# Patient Record
Sex: Male | Born: 1940
Health system: Southern US, Community
[De-identification: ages and names within clinical notes are randomized; demographics above are authoritative.]

## PROBLEM LIST (undated history)

## (undated) DIAGNOSIS — K449 Diaphragmatic hernia without obstruction or gangrene: Secondary | ICD-10-CM

## (undated) DIAGNOSIS — D126 Benign neoplasm of colon, unspecified: Secondary | ICD-10-CM

## (undated) DIAGNOSIS — M1990 Unspecified osteoarthritis, unspecified site: Secondary | ICD-10-CM

## (undated) DIAGNOSIS — K219 Gastro-esophageal reflux disease without esophagitis: Secondary | ICD-10-CM

## (undated) DIAGNOSIS — K649 Unspecified hemorrhoids: Secondary | ICD-10-CM

## (undated) DIAGNOSIS — R011 Cardiac murmur, unspecified: Secondary | ICD-10-CM

## (undated) DIAGNOSIS — I10 Essential (primary) hypertension: Secondary | ICD-10-CM

## (undated) DIAGNOSIS — K5790 Diverticulosis of intestine, part unspecified, without perforation or abscess without bleeding: Secondary | ICD-10-CM

## (undated) HISTORY — PX: POLYPECTOMY: SHX149

## (undated) HISTORY — PX: COLONOSCOPY: SHX174

## (undated) HISTORY — DX: Unspecified osteoarthritis, unspecified site: M19.90

## (undated) HISTORY — DX: Diverticulosis of intestine, part unspecified, without perforation or abscess without bleeding: K57.90

## (undated) HISTORY — DX: Unspecified hemorrhoids: K64.9

## (undated) HISTORY — DX: Benign neoplasm of colon, unspecified: D12.6

## (undated) HISTORY — PX: HERNIA REPAIR: SHX51

## (undated) HISTORY — DX: Diaphragmatic hernia without obstruction or gangrene: K44.9

## (undated) HISTORY — DX: Essential (primary) hypertension: I10

## (undated) HISTORY — DX: Gastro-esophageal reflux disease without esophagitis: K21.9

## (undated) HISTORY — DX: Cardiac murmur, unspecified: R01.1

---

## 1998-01-01 ENCOUNTER — Emergency Department (HOSPITAL_COMMUNITY): Admission: EM | Admit: 1998-01-01 | Discharge: 1998-01-02 | Payer: Self-pay | Admitting: Emergency Medicine

## 2000-06-19 ENCOUNTER — Inpatient Hospital Stay (HOSPITAL_COMMUNITY): Admission: EM | Admit: 2000-06-19 | Discharge: 2000-06-20 | Payer: Self-pay | Admitting: Emergency Medicine

## 2000-06-19 ENCOUNTER — Encounter: Payer: Self-pay | Admitting: Emergency Medicine

## 2000-06-20 ENCOUNTER — Encounter: Payer: Self-pay | Admitting: Cardiology

## 2002-08-22 ENCOUNTER — Encounter: Admission: RE | Admit: 2002-08-22 | Discharge: 2002-08-22 | Payer: Self-pay | Admitting: Family Medicine

## 2002-08-22 ENCOUNTER — Encounter: Payer: Self-pay | Admitting: Family Medicine

## 2004-10-31 ENCOUNTER — Emergency Department (HOSPITAL_COMMUNITY): Admission: EM | Admit: 2004-10-31 | Discharge: 2004-10-31 | Payer: Self-pay | Admitting: Emergency Medicine

## 2007-02-01 ENCOUNTER — Ambulatory Visit: Payer: Self-pay | Admitting: Thoracic Surgery

## 2007-02-06 ENCOUNTER — Ambulatory Visit (HOSPITAL_COMMUNITY): Admission: RE | Admit: 2007-02-06 | Discharge: 2007-02-06 | Payer: Self-pay | Admitting: Thoracic Surgery

## 2007-02-07 ENCOUNTER — Ambulatory Visit: Payer: Self-pay | Admitting: Thoracic Surgery

## 2007-05-09 ENCOUNTER — Encounter: Admission: RE | Admit: 2007-05-09 | Discharge: 2007-05-09 | Payer: Self-pay | Admitting: Thoracic Surgery

## 2007-05-09 ENCOUNTER — Ambulatory Visit: Payer: Self-pay | Admitting: Thoracic Surgery

## 2007-09-05 ENCOUNTER — Encounter: Admission: RE | Admit: 2007-09-05 | Discharge: 2007-09-05 | Payer: Self-pay | Admitting: Thoracic Surgery

## 2007-09-05 ENCOUNTER — Ambulatory Visit: Payer: Self-pay | Admitting: Thoracic Surgery

## 2008-03-04 ENCOUNTER — Ambulatory Visit: Payer: Self-pay | Admitting: Thoracic Surgery

## 2008-03-04 ENCOUNTER — Encounter: Admission: RE | Admit: 2008-03-04 | Discharge: 2008-03-04 | Payer: Self-pay | Admitting: Thoracic Surgery

## 2008-09-16 ENCOUNTER — Encounter: Admission: RE | Admit: 2008-09-16 | Discharge: 2008-09-16 | Payer: Self-pay | Admitting: Thoracic Surgery

## 2008-09-16 ENCOUNTER — Ambulatory Visit: Payer: Self-pay | Admitting: Thoracic Surgery

## 2008-12-03 ENCOUNTER — Encounter: Payer: Self-pay | Admitting: Gastroenterology

## 2009-01-16 DIAGNOSIS — D126 Benign neoplasm of colon, unspecified: Secondary | ICD-10-CM

## 2009-01-16 HISTORY — DX: Benign neoplasm of colon, unspecified: D12.6

## 2009-01-23 ENCOUNTER — Ambulatory Visit: Payer: Self-pay | Admitting: Gastroenterology

## 2009-02-06 ENCOUNTER — Encounter: Payer: Self-pay | Admitting: Gastroenterology

## 2009-02-06 ENCOUNTER — Ambulatory Visit: Payer: Self-pay | Admitting: Gastroenterology

## 2009-02-09 ENCOUNTER — Encounter: Payer: Self-pay | Admitting: Gastroenterology

## 2010-08-31 NOTE — Letter (Signed)
September 16, 2008   Joycelyn Rua, MD  [address] [place] [address]   Re:  Samuel [name], OLVEDA                  DOB:  October 02, 1940   Dear Dr. Lenise Arena:   The patient came today for followup.  His CT scan showed no evidence of  changes of his left pleural-based nodule, which we have been following.  This is for over 2 years.  Since there has been no change, and at most  it maybe slightly decreased in size, I will refer him back to you for  longterm followup.  I would suggest that he gets at least a chest x-ray  once a year as there is sometimes some slow growing cancers that can  occur after 2-3 years of followup.  I appreciate the opportunity of  seeing the patient.  His blood pressure was 146/86, pulse 71,  respirations 18, and sats were 98%.   Sincerely,   Ines Bloomer, M.D.  Electronically Signed   DPB/MEDQ  D:  09/16/2008  T:  09/17/2008  Job:  [id number]

## 2010-08-31 NOTE — Letter (Signed)
February 01, 2007   Joycelyn Rua, M.D.  [address]  [place] [address]   Re:  [name], [name]                  DOB:  1940/09/09   Dear Dr. Lenise Arena:   I saw Samuel [name] in the office today.  This 70 year old patient is a non-  smoker who was found to have an enlargement of a lesion in the left  lower lobe when compared to a CT scan of approximately 3 years ago.  It  has apparently increased on size.  The patient has had no fever or  chills.  He quit smoking in 1963.  He has had no hemoptysis.  He was  referred here for evaluation.   PAST MEDICAL HISTORY:  The past medical history is significant for  hypertension.   MEDICATIONS:  He indicates he is on no medication.   FAMILY HISTORY:  Family history is noncontributory.   SOCIAL HISTORY:  He is married with four children.  Quit smoking in  1963.  Does not drink alcohol.   REVIEW OF SYSTEMS:  Weight is 182 pounds, height is 6 feet. Weight has  been stable. CARDIAC:  No angina or atrial fibrillation. PULMONARY:  No  bronchitis or wheezing.  GI:  He has reflux and a hiatal hernia.  GU:  No kidney disease, dysuria or frequent urination.  VASCULAR:  No  claudication, DVT or TIA.  NEUROLOGICAL:  No dizziness, headaches, black  outs or seizures.  MUSCULOSKELETAL:  Denies joint pain.  PSYCHIATRIC:  No psychiatric illnesses.  ENT:  No change in eye sight or hearing.  HEMATOLOGIC:  No problems with bleeding or clotting disorders.   PHYSICAL EXAMINATION:  GENERAL:  He is a well-developed Caucasian male  in no acute distress.  HEENT:  Head, eyes, ears and nose are unremarkable.  NECK:  Supple  without thyromegaly, no supraclavicular or axillary adenopathy.  CHEST:  Clear to auscultation and percussion.  HEART:  Regular sinus rhythm, no murmurs.  ABDOMEN:  Soft, no hepatosplenomegaly.  EXTREMITIES:  Pulses are 2+, there is no clubbing or edema.   I discussed the situation with Samuel [name].  I have recommended that he  get a PET  scan.  I will see him back here after PET scan.  I think this  is probably a chronic problem.  Pulmonary function tests show an FVC of  3.89 with FEV1 of 3.21.  If this turns out to be negative on PET, then  will just follow him with serial CTs.   I appreciate the opportunity of seeing Samuel [name].   Ines Bloomer, M.D.  Electronically Signed   DPB/MEDQ  D:  02/01/2007  T:  02/02/2007  Job:  [id number]

## 2010-08-31 NOTE — Assessment & Plan Note (Signed)
OFFICE VISIT   Samuel [name], Samuel [name]  DOB:  07-11-1940                                        Sep 05, 2007  CHART #:  [id number]   HISTORY OF PRESENTING ILLNESS:  Samuel [name] is a 70 year old Caucasian  male, who presents to the office today for further followup regarding a  left lower lobe nodule.  He was last seen in the office on May 09, 2007, and a CAT scan that was done at that time showed a decrease in  size of this left lower lobe lung nodule, which is felt to be most  likely inflammatory in nature.  Patient has no complaints at this time.  He denies any fever, chills, night sweats, denies any weight-loss or  shortness of breath.   PHYSICAL EXAMINATION:  GENERAL:  This is a pleasant, 70 year old  Caucasian male, in no acute distress, alert, oriented and cooperative.  Blood pressure is 160/96, pulse rate 70, respirations 18, O2 sat 96% on  room air.  CARDIOVASCULAR:  Regular rate and rhythm, S1, S2, no murmurs, gallops or  rubs.  PULMONARY EXAM:  Clear to auscultation bilaterally.   The CT of the chest that was performed today showed stable subpleural  density in the left lower lobe, as compared to May 09, 2007.  This  has decreased in size, compared with the PET of October of 2008.  No new  or suspicious pulmonary findings and no lymphadenopathy.   IMPRESSION AND PLAN:  Left lower lobe nodule, which continues to  decrease in size (most likely inflammatory etiology).  Patient will  return in six months with CT of the chest.  He is to call for any  problems, questions or concerns in the interim.   Doree Fudge, PA   DZ/MEDQ  D:  09/05/2007  [name]:  09/05/2007  Job:  [id number]

## 2010-08-31 NOTE — Letter (Signed)
May 09, 2007   Joycelyn Rua, M.D.  [address], [place] [address]   Re:  Samuel [name], LOHMEYER                  DOB:  December 21, 1940   Dear Dr. Lenise Arena:   I saw [name] back today and repeated his CT scan.  It showed  improvement in his left lower lobe nodule, which has decreased in size  which goes along with this being inflammatory, and probably not a  cancer.  I think this is good news for him.   His blood pressure was 144/85.  Pulse:  92.  Respirations:  18.  Saturations were 94%.  I plan to see him back again in four months with  another CT scan for a final check.   Ines Bloomer, M.D.  Electronically Signed   DPB/MEDQ  D:  05/09/2007  T:  05/09/2007  Job:  [id number]

## 2010-08-31 NOTE — Letter (Signed)
February 07, 2007   Joycelyn Rua, M.D.  [address] [place] [address]   Re:  JULIES, CARMICKLE                  DOB:  26-Sep-1940   Dear Dr. Lenise Arena:   I saw Mr. Dermody back in the office today and we got a CT scan that  showed that his 1.5-cm pleural based nodule had no uptake on his PET  scan which would suggest benign etiology.  However, to be sure this is  not a slow-growing cancer, we will plan to see him back again in 3 weeks  with a CT scan.  His blood pressure is 167/80, pulse 76, respirations  20, sat was 96%.  Lungs were clear to auscultation percussion.   Ines Bloomer, M.D.  Electronically Signed   DPB/MEDQ  D:  02/07/2007  T:  02/08/2007  Job:  [id number]

## 2010-08-31 NOTE — Assessment & Plan Note (Signed)
OFFICE VISIT   [name], Samuel T  DOB:  01/03/1941                                        March 04, 2008  CHART #:  [id number]   HISTORY:  The patient is seen in routine followup on today's date.  He  has a history of a left lower lobe lung nodule that we have been  following by serial CT scans.  The patient reports that he has no new  pulmonary symptoms.  He specifically denies new cough, sputum  production, hemoptysis, or other constitutional symptoms.  He denies  weight loss.  He is having no significant issues related to shortness of  breath.  CT scan on 03/04/2008, was reviewed by Dr. Edwyna Shell.  Findings  include a stable pleural-based nodular density in the lateral left lower  lobe.  This is 11 x 13 mm, which is essentially the same measurements as  the previous study.  Findings also include a small-to-moderate hiatal  hernia and some mild right upper lobe emphysematous changes.   PHYSICAL EXAMINATION:  VITAL SIGNS:  Blood pressure is 165/92, pulse 79,  respirations 18, and oxygen saturation is 95% on room air.  GENERAL:  This is a well-developed, adult male in no acute distress.  PULMONARY:  Clear breath sounds throughout.  CARDIAC:  Regular rate and rhythm.   ASSESSMENT:  The patient is being followed closely for his left lower  lobe lung nodule.  His CT findings are very stable in appearance.  He  was seen on today's date by Dr. Edwyna Shell, who also reviewed the scan.  It  is Dr. Scheryl Darter opinion that the scan should be repeated in 6 months in  close followup.  We will schedule that and see him at that time.   Rowe Clack, P.A.-C.   Sherryll Burger  D:  03/04/2008  T:  03/05/2008  Job:  [id number]   cc:   Ines Bloomer, M.D.

## 2010-09-03 NOTE — Consult Note (Signed)
[place]. [facility]  Patient:    FAVOR, HACKLER                         MRN: [id number] Proc. Date: 06/19/00 Adm. Date:  [id number] Attending:  Clovis Cao CC:         Lacretia Leigh. Quintella Reichert, M.D.   Consultation Report  DATE OF BIRTH:  [birth year]  CHIEF COMPLAINT:  Chest pain.  REASON FOR ADMISSION:  I appreciate the opportunity to participate in the care of this very nice 70 year old Mr. Henri Medal, by providing consultative services to Dr. Lacretia Leigh. Hooper, his primary physician, at Dr. Charlane Ferretti request, in regards to Mr. Lawsons chest pain.  HISTORY OF PRESENT ILLNESS:  Mr. Sasso says that he has had a cough and bronchitis repeatedly over about the last three months since October or so. He was given oral antibiotics, and has had recurrent episodes, with a persistent cough and a feeling of congestion in his chest.  He has had several doses of oral steroids.  Approximately 10 days ago he first had an injection of prednisone and had a repeat injection of prednisone three days prior to this admission.  He has also been on oral prednisone, at a dose of 40 mg q.d.  Today, in addition to his cough and feeling of congestion, he developed a feeling of anterior chest pain which was somewhat related to deep breathing, but which he describes as crushing in quality.  There has been some diaphoresis as well, particularly when he has been walking.  He had no shortness of breath, nausea, vomiting, or radiation of the pain outside the anterior chest area.  He had no syncope.  He did not find anything which relieved the pain at home, but after coming to the emergency room, the pain did improve.  PAST MEDICAL HISTORY: 1. Hiatal hernia surgery by Dr. Izell Ravenna. Deaton in 1974.  He has never    had a problem with recurrence of symptoms of a hiatal hernia since    his operation. 2. Knee surgery in 1974. 3. At age 25 he suffered an anterior skull fracture when a  heavy    metal hook fell on his head.  There was primary treatment, followed    some months later by the need to remove infected fragments.  He    apparently never had any brain damage related to that. 4. Approximately two years ago he had pneumonia.  He had a pneumonia    vaccine last year.  He did not have a flu shot this year. 5. He has also been treated for hypertension.  CURRENT MEDICATIONS: 1. Cozaar 50 mg q.d. 2. Hydrochlorothiazide 12.5 mg q.d. 3. Prednisone as noted above.  FAMILY HISTORY:  His father died at age 61 of Alzheimers disease.  His mother died at age 74 of pancreatic cancer.  His brother died at age 17 of pancreatic cancer.  His sister died at age 85 of bone cancer.  SOCIAL HISTORY:  He has been married to his present wife for 42 years.  They have four children, who have grown up.  Two of their children live in the neighboring proximal two houses.  His other children are much further away. 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.  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  REVIEW OF SYSTEMS:  He has had no fever or chills.  His sweats with exertion are noted above.  He has not lost any weight.  He denies claudication or edema.  He says he has had very little sleep since he was placed on the prednisone, and that is about two weeks or oral prednisone.  EYES:  No diplopia or blurring.  He wears glasses for reading.  ENT:  No deafness or dizziness.  He has his own teeth.  CARDIOVASCULAR:  See the history of present illness.  No PND or orthopnea.  RESPIRATORY:  See the history of present illness.  He has never smoked to any significant degree.  GI:  No dysphagia or heartburn.  GENITOURINARY:  No dysuria or pyuria.  He has nocturia x 1. MUSCULOSKELETAL:  No swollen joints or myalgia.  SKIN/BREASTS:  No rash or nodule.  NEUROLOGIC:  No faintness or syncope.  PSYCHIATRIC:  No depression or hallucinations.  ENDOCRINE:  No  known diabetes or thyroid disease. HEMATOLOGY/LYMPH:  No swelling in the neck, axillae, or groins. ALLERGIC/LYMPHATICS:  He is made nervous by CODEINE, but has no other drug allergy.  PHYSICAL EXAMINATION:  VITAL SIGNS:  Blood pressure 161/80, pulse 80 and regular, respirations 16-18, unlabored, temperature 99 degrees.  GENERAL:  He is a well-developed, well-nourished man, who looks about his stated age of 11.  He is oriented to person, place, and time.  His mood and affect are normal.  HEENT:  Conjunctivae and lids reveal no xanthelasma or icterus.  His teeth are his own and in good repair.  Oral mucosa reveals no pallor or cyanosis.  NECK:  Supple and symmetrical.  Trachea is midline and mobile.  There is no palpable thyromegaly or cervical nodes.  No carotid bruit or jugular venous distention.  LUNGS:  His respiratory effort is normal at rest.  His lungs reveal scattered rhonchi, but few if any wheezes.  The forced expiratory time is normal.  BACK:  Straight and nontender.  NEUROLOGIC:  His gait is not tested.  It is unlikely that he could undergo a full stress test, given his recent pulmonary history.  Muscle strength and tone are age-appropriate.  CARDIAC:  The cardiac apical impulse is cryptic.  The left border of cardiac dullness is within the left anterior axillary line.  The heart rhythm is regular and the rate is normal.  There is no gallop, click, or murmur.  EXTREMITIES:  His digits and nails reveal no clubbing or cyanosis.  The femoral arteries are without bruit.  The pedal pulses are intact, with 2+ posterior tibial and dorsalis pedis pulses bilaterally.  The legs reveal no edema or varicosities.  SKIN:  The skin and subcutaneous tissue reveal no stasis dermatitis or ulcer.  ABDOMEN:  Flat, nontender.  There is no palpable enlargement of the liver or spleen.  Bowel sounds are present.  The abdominal aorta is not palpable, and he has no bruit.  LABORATORY  DATA:  The available laboratory data includes an electrocardiogram  which is normal.  First troponin I 0.03, CPK 137, with MB band of 3.7, unremarkable level. White cells 11,000, mean cell volume 87, hemoglobin 15.1, 87% polys, 12% lymphocytes, 1% monocytes.  Electrolytes normal except for potassium of 5.6. Glucose 119.  Chest x-ray shows the heart is of normal size.  There is slight increase in bronchovascular markings, consistent with pulmonary congestion.  This is read by the radiologist, as showing pulmonary vascular congestion, but I disagree.  IMPRESSION:  Mr. Mesta has new onset of anterior chest discomfort, which he describes  as crushing.  At this point, he has no evidence of an acute myocardial infarction, but certainly must be considered, to be at the right age to have the acute onset of new angina.  RECOMMENDATIONS:  The most appropriate course is to keep him in the hospital overnight and repeat his enzymes.  If his enzymes are negative, a Persantine Cardiolyte test could be done.  If his enzymes are positive, then he should have a cardiac catheterization.  I cannot exclude the possibility that some combination of his many known problems, mainly bronchitis, known hiatal hernia, and sleep deprivation, may be part of the reason for his chest discomfort, but certainly coronary artery disease now takes precedence in terms of our diagnostic evaluation.  Finally, will place him in a telemetry bed, and give him a sleeping pill to help get through the night.  He will be continued on prednisone. DD:  06/19/00 TD:  06/20/00 Job: [id number]

## 2011-02-03 ENCOUNTER — Encounter (HOSPITAL_COMMUNITY)
Admission: RE | Admit: 2011-02-03 | Discharge: 2011-02-03 | Disposition: A | Discharge status: Home / Self Care | Payer: Medicare Other | Source: Ambulatory Visit | Attending: Orthopedic Surgery | Admitting: Orthopedic Surgery

## 2011-02-03 ENCOUNTER — Other Ambulatory Visit (HOSPITAL_COMMUNITY): Payer: Self-pay | Admitting: Orthopedic Surgery

## 2011-02-03 DIAGNOSIS — M1712 Unilateral primary osteoarthritis, left knee: Secondary | ICD-10-CM

## 2011-02-03 LAB — COMPREHENSIVE METABOLIC PANEL WITH GFR
ALT: 21 U/L (ref 0–53)
AST: 28 U/L (ref 0–37)
Albumin: 3.9 g/dL (ref 3.5–5.2)
Alkaline Phosphatase: 90 U/L (ref 39–117)
BUN: 25 mg/dL — ABNORMAL HIGH (ref 6–23)
CO2: 28 meq/L (ref 19–32)
Calcium: 9.7 mg/dL (ref 8.4–10.5)
Chloride: 104 meq/L (ref 96–112)
Creatinine, Ser: 0.66 mg/dL (ref 0.50–1.35)
GFR calc Af Amer: >90 mL/min
GFR calc non Af Amer: >90 mL/min
Glucose, Bld: 101 mg/dL — ABNORMAL HIGH (ref 70–99)
Potassium: 5.3 meq/L — ABNORMAL HIGH (ref 3.5–5.1)
Sodium: 141 meq/L (ref 135–145)
Total Bilirubin: 0.3 mg/dL (ref 0.3–1.2)
Total Protein: 7.3 g/dL (ref 6.0–8.3)

## 2011-02-03 LAB — TYPE AND SCREEN
ABO/RH(D): B POS
Antibody Screen: NEGATIVE

## 2011-02-03 LAB — URINALYSIS, ROUTINE W REFLEX MICROSCOPIC
Bilirubin Urine: NEGATIVE
Leukocytes, UA: NEGATIVE
Nitrite: NEGATIVE
Specific Gravity, Urine: 1.021 (ref 1.005–1.030)
Urobilinogen, UA: 0.2 mg/dL (ref 0.0–1.0)

## 2011-02-03 LAB — CBC
HCT: 41.8 % (ref 39.0–52.0)
Hemoglobin: 14.7 g/dL (ref 13.0–17.0)
MCV: 87.1 fL (ref 78.0–100.0)
RBC: 4.8 MIL/uL (ref 4.22–5.81)
RDW: 13.9 % (ref 11.5–15.5)
WBC: 9.3 10*3/uL (ref 4.0–10.5)

## 2011-02-03 LAB — DIFFERENTIAL
Basophils Absolute: 0.1 K/uL (ref 0.0–0.1)
Basophils Relative: 1 % (ref 0–1)
Eosinophils Absolute: 0.3 K/uL (ref 0.0–0.7)
Eosinophils Relative: 3 % (ref 0–5)
Lymphocytes Relative: 31 % (ref 12–46)
Lymphs Abs: 2.9 K/uL (ref 0.7–4.0)
Monocytes Absolute: 0.9 K/uL (ref 0.1–1.0)
Monocytes Relative: 10 % (ref 3–12)
Neutro Abs: 5.1 K/uL (ref 1.7–7.7)
Neutrophils Relative %: 55 % (ref 43–77)

## 2011-02-03 LAB — PROTIME-INR
INR: 0.9 (ref 0.00–1.49)
Prothrombin Time: 12.3 s (ref 11.6–15.2)

## 2011-02-03 LAB — APTT: aPTT: 27 s (ref 24–37)

## 2011-02-03 LAB — ABO/RH: ABO/RH(D): B POS

## 2011-02-03 LAB — SURGICAL PCR SCREEN
MRSA, PCR: NEGATIVE
Staphylococcus aureus: NEGATIVE

## 2011-02-04 LAB — URINE CULTURE: Culture: NO GROWTH

## 2011-02-14 ENCOUNTER — Inpatient Hospital Stay (HOSPITAL_COMMUNITY)
Admission: RE | Admit: 2011-02-14 | Discharge: 2011-02-16 | DRG: 470 | Disposition: A | Discharge status: Home Health | Payer: Medicare Other | Source: Ambulatory Visit | Attending: Orthopedic Surgery | Admitting: Orthopedic Surgery

## 2011-02-14 DIAGNOSIS — Z01812 Encounter for preprocedural laboratory examination: Secondary | ICD-10-CM

## 2011-02-14 DIAGNOSIS — D62 Acute posthemorrhagic anemia: Secondary | ICD-10-CM | POA: Diagnosis not present

## 2011-02-14 DIAGNOSIS — Z01810 Encounter for preprocedural cardiovascular examination: Secondary | ICD-10-CM

## 2011-02-14 DIAGNOSIS — M1710 Unilateral primary osteoarthritis, unspecified knee: Principal | ICD-10-CM | POA: Diagnosis present

## 2011-02-14 DIAGNOSIS — Z01818 Encounter for other preprocedural examination: Secondary | ICD-10-CM

## 2011-02-14 DIAGNOSIS — K219 Gastro-esophageal reflux disease without esophagitis: Secondary | ICD-10-CM | POA: Diagnosis present

## 2011-02-14 DIAGNOSIS — I10 Essential (primary) hypertension: Secondary | ICD-10-CM | POA: Diagnosis present

## 2011-02-14 DIAGNOSIS — IMO0002 Reserved for concepts with insufficient information to code with codable children: Principal | ICD-10-CM | POA: Diagnosis present

## 2011-02-14 HISTORY — PX: REPLACEMENT TOTAL KNEE: SUR1224

## 2011-02-15 LAB — BASIC METABOLIC PANEL
BUN: 14 mg/dL (ref 6–23)
CO2: 26 mEq/L (ref 19–32)
Chloride: 103 mEq/L (ref 96–112)
Creatinine, Ser: 0.77 mg/dL (ref 0.50–1.35)

## 2011-02-15 LAB — CBC
HCT: 29.3 % — ABNORMAL LOW (ref 39.0–52.0)
MCH: 30.1 pg (ref 26.0–34.0)
MCHC: 33.8 g/dL (ref 30.0–36.0)
MCV: 89.1 fL (ref 78.0–100.0)
RDW: 13.5 % (ref 11.5–15.5)

## 2011-02-15 NOTE — Op Note (Signed)
NAMEHAZEL, WRINKLE                  ACCOUNT NO.:  [id number]  MEDICAL RECORD NO.:  [id number]  LOCATION:  2899                         FACILITY:  [facility]  PHYSICIAN:  Mila Homer. Sherlean Foot, M.D. DATE OF BIRTH:  [birth year]  DATE OF PROCEDURE:  02/14/2011 DATE OF DISCHARGE:                              OPERATIVE REPORT   SURGEON:  Mila Homer. Sherlean Foot, MD  ASSISTANT:  Altamese Cabal, PA-C  ANESTHESIA:  General.  PREOPERATIVE DIAGNOSIS:  Left knee osteoarthritis.  POSTOPERATIVE DIAGNOSIS:  Left knee osteoarthritis.  PROCEDURE:  Left total knee arthroplasty.  INDICATION FOR PROCEDURE:  The patient is a 70 year old white male with failure of conservative measures for osteoarthritis of left knee. Informed consent was obtained.  DESCRIPTION OF PROCEDURE:  The patient was laid supine and administered general anesthesia.  The left leg was prepped and draped in the usual sterile fashion.  A midline incision was made with a #10 blade.  New blade was used to make a median parapatellar arthrotomy and perform synovectomy.  I elevated deep MCL off the medial crest of the tibia.  We did not go distally or much posteromedially since this was a valgus knee.  Preoperative range of motion was -10 to 105.With the valgus alignment at 15 degrees.  I then everted the patella.  There was extreme amount of osteophyte formation on the patella.  I removed those, measured to a 25 mm thick, reamed down 9 mm, and drilled 3 lug holes through the 35-mm template.  I recreated the native thickness.  I then subluxed the patella laterally and went into flexion.  I used the external medullary alignment system on the tibia to make perpendicular cut to the anatomic axis of the tibia.  I used the intramedullary system to make a 4-degree distal femoral cut since this was a valgus knee.  I sized to a size F, pinned to 3-degree external rotation holes.  I then made the anterior, posterior, and chamfer cuts with sagittal saw.   I then placed a lamina spreader in the knee.  I did do a fair amount of releasing laterally to get flexion-extension gap balance with a 17-mm spacer block.  I then finished the femur with a size F finishing block, tibia with a size 7 tibial tray drilling keel.  I then trialed with a 7 tibia, F femur, 17 insert.  I did do a little bit more releasing on the IT band to obtain balance in extension.  At this point, I placed 7-mm trial insert and then a 35 patella.  I got good flexion-extension gap balance and excellent patellar tracking.  I removed all trial components and copiously irrigated.  I then cemented the components removing all excess cement, allowing the cement to harden extension.  I left a Hemovac coming out superolaterally and deep the arthrotomy pain catheter, and coming out superomedial and superficial to arthrotomy.  I let the tourniquet down and obtained hemostasis.  I then closed the arthrotomy with figure-of-eight #1 Vicryl suture, buried 0 Vicryl sutures, subcuticular 2-0 Vicryl stitch, skin  staples.  Dressed with Xeroform, dressing sponges, sterile Webril, Cody stocking.  COMPLICATIONS:  None.  DRAINS:  One Hemovac  and one pain catheter.  ESTIMATED BLOOD LOSS:  300 mL.  TOURNIQUET TIME:  56 minutes.          ______________________________ Mila Homer Sherlean Foot, M.D.     SDL/MEDQ  D:  02/14/2011  T:  02/14/2011  Job:  [id number]  cc:   Primary care physician  Electronically Signed by Georgena Spurling M.D. on 02/15/2011 05:27:06 PM

## 2011-02-16 LAB — BASIC METABOLIC PANEL
Calcium: 8.5 mg/dL (ref 8.4–10.5)
GFR calc non Af Amer: 84 mL/min — ABNORMAL LOW (ref >90)
Glucose, Bld: 112 mg/dL — ABNORMAL HIGH (ref 70–99)
Sodium: 142 mEq/L (ref 135–145)

## 2011-02-16 LAB — CBC
MCH: 29.3 pg (ref 26.0–34.0)
MCHC: 33 g/dL (ref 30.0–36.0)
Platelets: 184 10*3/uL (ref 150–400)

## 2011-04-25 ENCOUNTER — Ambulatory Visit (HOSPITAL_COMMUNITY)
Admission: RE | Admit: 2011-04-25 | Discharge: 2011-04-25 | Disposition: A | Discharge status: Home / Self Care | Payer: Medicare Other | Source: Ambulatory Visit | Attending: Orthopedic Surgery | Admitting: Orthopedic Surgery

## 2011-04-25 DIAGNOSIS — M79609 Pain in unspecified limb: Secondary | ICD-10-CM

## 2011-04-25 DIAGNOSIS — R52 Pain, unspecified: Secondary | ICD-10-CM

## 2011-04-25 DIAGNOSIS — IMO0002 Reserved for concepts with insufficient information to code with codable children: Secondary | ICD-10-CM

## 2011-04-25 DIAGNOSIS — R229 Localized swelling, mass and lump, unspecified: Secondary | ICD-10-CM | POA: Insufficient documentation

## 2011-04-25 NOTE — Progress Notes (Signed)
Left lower extremity venous duplex completed.  Preliminary report is negative for DVT, SVT, or a Baker's cyst. Smiley Houseman 04/25/2011, 8:37 AM

## 2011-12-05 ENCOUNTER — Other Ambulatory Visit (HOSPITAL_BASED_OUTPATIENT_CLINIC_OR_DEPARTMENT_OTHER): Payer: Self-pay | Admitting: Family Medicine

## 2011-12-05 DIAGNOSIS — R109 Unspecified abdominal pain: Secondary | ICD-10-CM

## 2011-12-06 ENCOUNTER — Ambulatory Visit (HOSPITAL_BASED_OUTPATIENT_CLINIC_OR_DEPARTMENT_OTHER)
Admission: RE | Admit: 2011-12-06 | Discharge: 2011-12-06 | Disposition: A | Discharge status: Home / Self Care | Payer: Medicare Other | Source: Ambulatory Visit | Attending: Family Medicine | Admitting: Family Medicine

## 2011-12-06 DIAGNOSIS — J984 Other disorders of lung: Secondary | ICD-10-CM | POA: Insufficient documentation

## 2011-12-06 DIAGNOSIS — R109 Unspecified abdominal pain: Secondary | ICD-10-CM

## 2011-12-06 DIAGNOSIS — K449 Diaphragmatic hernia without obstruction or gangrene: Secondary | ICD-10-CM | POA: Insufficient documentation

## 2011-12-06 DIAGNOSIS — K5730 Diverticulosis of large intestine without perforation or abscess without bleeding: Secondary | ICD-10-CM | POA: Insufficient documentation

## 2011-12-06 DIAGNOSIS — K7689 Other specified diseases of liver: Secondary | ICD-10-CM | POA: Insufficient documentation

## 2011-12-06 DIAGNOSIS — N400 Enlarged prostate without lower urinary tract symptoms: Secondary | ICD-10-CM | POA: Insufficient documentation

## 2011-12-06 DIAGNOSIS — N281 Cyst of kidney, acquired: Secondary | ICD-10-CM | POA: Insufficient documentation

## 2011-12-06 MED ORDER — IOHEXOL 300 MG/ML  SOLN
100.0000 mL | Freq: Once | INTRAMUSCULAR | Status: AC | PRN
  Administered 2011-12-06: 100 mL via INTRAVENOUS

## 2012-02-14 ENCOUNTER — Telehealth: Payer: Self-pay | Admitting: Gastroenterology

## 2012-02-14 NOTE — Telephone Encounter (Signed)
OK. He probably will need a work in appt with Amy or Gunnar Fusi since his appt is 2 months out.

## 2012-02-14 NOTE — Telephone Encounter (Signed)
Patient has been treated by his primary care for diverticulitis, but the CT showed diverticulosis.  The patient states that they had him finish out a course of cipro (he was unable to tolerate flagyl) for 10 days.  The patient reports that the pain resolved with the cipro.  He developed LLQ pain again yesterday was seen again by his primary care and they have started him on Augmentin.  He is requesting an appt for a colonoscopy.  He had a colonoscopy in 2010 that showed 2 polyps adenomatous and diverticulosis of the sigmoid colon.  He is given an appt for 03/12/12.  He will finish the antibiotics prescribed by his primary.  He will call me back if the pain is not resolved or worsens to be worked in with an extender.  He has only had screening colonoscopy with our office no other office visit history.

## 2012-02-20 ENCOUNTER — Telehealth: Payer: Self-pay | Admitting: Gastroenterology

## 2012-02-20 NOTE — Telephone Encounter (Signed)
Patient reports that he has been on Augmentin for 7 days with no improvement in the LLQ pain.  As discussed last week he was to call back for no improvement in his symptoms.  He will come in and see Mike Gip PA tomorrow at 10:30

## 2012-02-21 ENCOUNTER — Encounter: Payer: Self-pay | Admitting: Physician Assistant

## 2012-02-21 ENCOUNTER — Ambulatory Visit (INDEPENDENT_AMBULATORY_CARE_PROVIDER_SITE_OTHER): Payer: Medicare Other | Admitting: Physician Assistant

## 2012-02-21 VITALS — BP 140/80 | HR 64 | Ht 71.0 in | Wt 199.0 lb

## 2012-02-21 DIAGNOSIS — K5730 Diverticulosis of large intestine without perforation or abscess without bleeding: Secondary | ICD-10-CM

## 2012-02-21 DIAGNOSIS — I10 Essential (primary) hypertension: Secondary | ICD-10-CM

## 2012-02-21 DIAGNOSIS — K219 Gastro-esophageal reflux disease without esophagitis: Secondary | ICD-10-CM | POA: Insufficient documentation

## 2012-02-21 DIAGNOSIS — K5790 Diverticulosis of intestine, part unspecified, without perforation or abscess without bleeding: Secondary | ICD-10-CM

## 2012-02-21 MED ORDER — CIPROFLOXACIN HCL 500 MG PO TABS: 500.0000 mg | ORAL_TABLET | Freq: Two times a day (BID) | ORAL | Status: AC

## 2012-02-21 NOTE — Progress Notes (Signed)
Subjective:    Patient ID: NIL Samuel [name], male    DOB: 11-12-1940, 71 y.o.   MRN: [id number]  HPI Samuel [name] is a 71 year old white male known to Dr. Russella Dar from prior colonoscopy. He last had colonoscopy in October of 2010, was noted to have moderate diverticulosis of the descending colon and 3 polyps were removed 5 mm or less in size. These were tubular adenomas. He is scheduled for 5 year followup. Patient had an episode of diverticulitis which was treated by primary care in August of 2013 With a course of Flagyl and Cipro. Patient says that the Flagyl made him very nauseated and he could not of stay on the Flagyl however did complete a 10 day course of Cipro. His symptoms completely resolved. He did have a CT scan during that episode which did not show any evidence of diverticulitis. Patient states that he was doing well over the past few months until about a week ago when his left lower quadrant pain recurred. U. again was seen by primary care and was started on a course of Augmentin 875 twice daily 7 days ago. He had labs done at that time which showed a normal CBC. The patient states that he is about 25% better, but does not feel that he is responding as well to the Augmentin as he did the Cipro previously. He is not having any problems with fever or chills, no nausea or vomiting, he says his pain is mild to moderate. He is not having any problems with constipation or diarrhea and his appetite has been good. He is concerned, because his symptoms have not resolved after one week of antibiotics. He states that he has had previous episodes of diverticulitis but had not been bothered over the past few years.    Review of Systems  Constitutional: Negative.   HENT: Negative.   Eyes: Negative.   Respiratory: Negative.   Cardiovascular: Negative.   Gastrointestinal: Positive for abdominal pain.  Genitourinary: Negative.   Musculoskeletal: Negative.   Neurological: Negative.   Hematological: Negative.     Psychiatric/Behavioral: Negative.    Outpatient Prescriptions Prior to Visit  Medication Sig Dispense Refill  . aspirin 81 MG tablet Take 81 mg by mouth daily.      Marland Kitchen esomeprazole (NEXIUM) 40 MG capsule Take 40 mg by mouth daily before breakfast. Pt takes one tablet every 4 days      . hydrochlorothiazide (HYDRODIURIL) 25 MG tablet Take 25 mg by mouth as directed.      Marland Kitchen losartan (COZAAR) 100 MG tablet Take 100 mg by mouth daily.            Allergies  Allergen Reactions  . Codeine     REACTION: Agitation   Patient Active Problem List  Diagnosis  . Diverticulosis  . HTN (hypertension)  . GERD (gastroesophageal reflux disease)   History  Substance Use Topics  . Smoking status: Former Smoker    Quit date: 02/21/1964  . Smokeless tobacco: Never Used  . Alcohol Use: No    Objective:   Physical Exam a well-developed older white male in no acute distress, pleasant blood pressure 140/80 pulse 64 height 5 foot 11 weight 199. HEENT; nontraumatic normocephalic EOMI PERRLA sclera anicteric, Neck ;supple no JVD, Cardiovascular; regular rate and rhythm with S1-S2 no murmur or gallop, Pulmonary; clear bilaterally, Abdomen; soft nondistended, bowel sounds are active, he is mildly tender in the left lower quadrant no guarding or rebound no palpable mass or hepatosplenomegaly, Rectal; not done,  Extremities; no clubbing cyanosis or edema skin warm and dry, Psych; mood and affect normal and appropriate.        Assessment & Plan:  #48 71 year old male with recurrent acute diverticulitis, partially responsive to one-week course of Augmentin. He does not have any worrisome signs or symptoms currently and suspect he does need a longer course of antibiotics. #2 history of adenomatous colon polyp, last colonoscopy 2010 due for followup 2015 #3 on a GERD; continue Nexium.  Plan; patient will complete his 10 day course of Augmentin Will add Cipro 500 mg by mouth twice daily for 10 days Add align  one by mouth daily x2 weeks Patient will call with a progress report when he finishes the antibiotics, if his symptoms have not completely resolved at that time he will need repeat CT and reassessment area He also knows to call should the symptoms worsen at any point in the interim.

## 2012-02-21 NOTE — Progress Notes (Signed)
Reviewed and agree with management plans.  Leightyn Cina T. Kern Gingras MD FACG  

## 2012-02-21 NOTE — Patient Instructions (Addendum)
Take Align 1 capsule daily for 3 weeks. Finish the Augmentin. We sent a prescription for Cipro to [facility], [place].  Call Dr. Ardell Isaacs nurse Lavonna Rua, with your progress once you finish the antibiotics. Call [phone]. Choose option 2 for nurse.

## 2012-03-12 ENCOUNTER — Ambulatory Visit: Payer: Medicare Other | Admitting: Gastroenterology

## 2012-04-02 ENCOUNTER — Other Ambulatory Visit (INDEPENDENT_AMBULATORY_CARE_PROVIDER_SITE_OTHER): Payer: Medicare Other

## 2012-04-02 ENCOUNTER — Telehealth: Payer: Self-pay | Admitting: Gastroenterology

## 2012-04-02 DIAGNOSIS — R1032 Left lower quadrant pain: Secondary | ICD-10-CM

## 2012-04-02 LAB — CREATININE, SERUM: Creatinine, Ser: 1 mg/dL (ref 0.4–1.5)

## 2012-04-02 NOTE — Telephone Encounter (Signed)
Patient reports no improvement in his symptoms.  He is gassy, bloated and having continued abdominal pain.  He took peptobismol over the weekend and this am he has black stools.  I did explain that pepto bismol will turn the stools black.  He also reports diarrhea. The last office visit on 02/21/12, you were going to consider a CT.  Do you want to see him or set up the CT directly.  Mike Gip PA please advise

## 2012-04-02 NOTE — Telephone Encounter (Signed)
Yes, make sure he actually took the course of Cipro I gave him.. And if so, and still having pain then would do CT abd/pelvis with contrast  This week, and can decide from there  thanks

## 2012-04-02 NOTE — Telephone Encounter (Signed)
Patient is scheduled for CT on 04/03/12 10:30.  He will come for labs today.

## 2012-04-03 ENCOUNTER — Ambulatory Visit (INDEPENDENT_AMBULATORY_CARE_PROVIDER_SITE_OTHER)
Admission: RE | Admit: 2012-04-03 | Discharge: 2012-04-03 | Disposition: A | Discharge status: Home / Self Care | Payer: Medicare Other | Source: Ambulatory Visit | Attending: Physician Assistant | Admitting: Physician Assistant

## 2012-04-03 DIAGNOSIS — R1032 Left lower quadrant pain: Secondary | ICD-10-CM

## 2012-04-03 MED ORDER — IOHEXOL 300 MG/ML  SOLN
100.0000 mL | Freq: Once | INTRAMUSCULAR | Status: AC | PRN
  Administered 2012-04-03: 100 mL via INTRAVENOUS

## 2012-04-04 ENCOUNTER — Ambulatory Visit (AMBULATORY_SURGERY_CENTER): Payer: Medicare Other

## 2012-04-04 ENCOUNTER — Encounter: Payer: Self-pay | Admitting: Gastroenterology

## 2012-04-04 VITALS — Ht 71.0 in | Wt 199.2 lb

## 2012-04-04 DIAGNOSIS — Z1211 Encounter for screening for malignant neoplasm of colon: Secondary | ICD-10-CM

## 2012-04-04 DIAGNOSIS — Z86010 Personal history of colonic polyps: Secondary | ICD-10-CM

## 2012-04-04 DIAGNOSIS — R109 Unspecified abdominal pain: Secondary | ICD-10-CM

## 2012-04-04 MED ORDER — MOVIPREP 100 G PO SOLR: ORAL | Status: DC

## 2012-04-17 ENCOUNTER — Ambulatory Visit (AMBULATORY_SURGERY_CENTER): Payer: Medicare Other | Admitting: Gastroenterology

## 2012-04-17 ENCOUNTER — Encounter: Payer: Self-pay | Admitting: Gastroenterology

## 2012-04-17 VITALS — BP 149/91 | HR 59 | Temp 98.1°F | Resp 19 | Ht 71.0 in | Wt 199.2 lb

## 2012-04-17 DIAGNOSIS — R1032 Left lower quadrant pain: Secondary | ICD-10-CM

## 2012-04-17 DIAGNOSIS — Z86010 Personal history of colonic polyps: Secondary | ICD-10-CM

## 2012-04-17 DIAGNOSIS — Z1211 Encounter for screening for malignant neoplasm of colon: Secondary | ICD-10-CM

## 2012-04-17 DIAGNOSIS — R109 Unspecified abdominal pain: Secondary | ICD-10-CM

## 2012-04-17 MED ORDER — GLYCOPYRROLATE 2 MG PO TABS: 2.0000 mg | ORAL_TABLET | Freq: Two times a day (BID) | ORAL | Status: DC

## 2012-04-17 MED ORDER — SODIUM CHLORIDE 0.9 % IV SOLN: 500.0000 mL | INTRAVENOUS | Status: DC

## 2012-04-17 NOTE — Patient Instructions (Addendum)
YOU HAD AN ENDOSCOPIC PROCEDURE TODAY AT THE [facility]: Refer to the procedure report that was given to you for any specific questions about what was found during the examination.  If the procedure report does not answer your questions, please call your gastroenterologist to clarify.  If you requested that your care partner not be given the details of your procedure findings, then the procedure report has been included in a sealed envelope for you to review at your convenience later.  YOU SHOULD EXPECT: Some feelings of bloating in the abdomen. Passage of more gas than usual.  Walking can help get rid of the air that was put into your GI tract during the procedure and reduce the bloating. If you had a lower endoscopy (such as a colonoscopy or flexible sigmoidoscopy) you may notice spotting of blood in your stool or on the toilet paper. If you underwent a bowel prep for your procedure, then you may not have a normal bowel movement for a few days.  DIET: Your first meal following the procedure should be a light meal and then it is ok to progress to your normal diet.  A half-sandwich or bowl of soup is an example of a good first meal.  Heavy or fried foods are harder to digest and may make you feel nauseous or bloated.  Likewise meals heavy in dairy and vegetables can cause extra gas to form and this can also increase the bloating.  Drink plenty of fluids but you should avoid alcoholic beverages for 24 hours.  ACTIVITY: Your care partner should take you home directly after the procedure.  You should plan to take it easy, moving slowly for the rest of the day.  You can resume normal activity the day after the procedure however you should NOT DRIVE or use heavy machinery for 24 hours (because of the sedation medicines used during the test).    SYMPTOMS TO REPORT IMMEDIATELY: A gastroenterologist can be reached at any hour.  During normal business hours, 8:30 AM to 5:00 PM Monday through Friday,  call [phone].  After hours and on weekends, please call the GI answering service at [phone] who will take a message and have the physician on call contact you.   Following lower endoscopy (colonoscopy or flexible sigmoidoscopy):  Excessive amounts of blood in the stool  Significant tenderness or worsening of abdominal pains  Swelling of the abdomen that is new, acute  Fever of 100F or higher  FOLLOW UP: If any biopsies were taken you will be contacted by phone or by letter within the next 1-3 weeks.  Call your gastroenterologist if you have not heard about the biopsies in 3 weeks.  Our staff will call the home number listed on your records the next business day following your procedure to check on you and address any questions or concerns that you may have at that time regarding the information given to you following your procedure. This is a courtesy call and so if there is no answer at the home number and we have not heard from you through the emergency physician on call, we will assume that you have returned to your regular daily activities without incident.  SIGNATURES/CONFIDENTIALITY: You and/or your care partner have signed paperwork which will be entered into your electronic medical record.  These signatures attest to the fact that that the information above on your After Visit Summary has been reviewed and is understood.  Full responsibility of the confidentiality of this   discharge information lies with you and/or your care-partner.   Handouts on diverticulosis, high fiber diet, hemorrhoids Repeat colonoscopy in 5 years Start robinol as directed, your medicine is at the pharmacy Please call [phone] in the next 1-3 business days to schedule an appointment with Dr Russella Dar in 4-6 weeks

## 2012-04-17 NOTE — Op Note (Addendum)
[facility] [address] [place], [address]   COLONOSCOPY PROCEDURE REPORT  PATIENT: Samuel [name], Samuel [name]  MR#: [id number] BIRTHDATE: 1940-05-05 , 71  yrs. old GENDER: Male ENDOSCOPIST: Meryl Dare, MD, [facility] PROCEDURE DATE:  04/17/2012 PROCEDURE:   Colonoscopy, diagnostic ASA CLASS:   Class II INDICATIONS:Patient's personal history of adenomatous colon polyps, LLQ pain MEDICATIONS: MAC sedation, administered by CRNA and propofol (Diprivan) 300mg  IV DESCRIPTION OF PROCEDURE:   After the risks benefits and alternatives of the procedure were thoroughly explained, informed consent was obtained.  A digital rectal exam revealed no abnormalities of the rectum.   The Fuse-Demo-Scope and the Olympus pediatric colonoscope were introduced through the anus and advanced to the cecum, which was identified by both the appendix and ileocecal valve. No adverse events experienced.   The quality of the prep was good, using MoviPrep  The instrument was then slowly withdrawn as the colon was fully examined.  COLON FINDINGS: Moderate diverticulosis was noted in the sigmoid colon and descending colon.   The colon was otherwise normal. There was no diverticulosis, inflammation, polyps or cancers unless previously stated.  Retroflexed views revealed internal hemorrhoids. The time to cecum=7 minutes 52 seconds.  Withdrawal time=9 minutes 35 seconds.  The scope was withdrawn and the procedure completed.  COMPLICATIONS: There were no complications.  ENDOSCOPIC IMPRESSION: 1.   Moderate diverticulosis was noted in the sigmoid colon and descending colon 2.   Internal hemorrhoids  RECOMMENDATIONS: 1.  High fiber diet with liberal fluid intake. 2.  Repeat Colonoscopy in 5 years. 3.  Robinul forte 2 mg po bid, #60, 11 refills 4.  Office appt in 4-6 weeks   eSigned:  Meryl Dare, MD, [facility] 04/17/2012 11:25 AM   cc: Joycelyn Rua, MD

## 2012-04-17 NOTE — Progress Notes (Signed)
Patient did not experience any of the following events: a burn prior to discharge; a fall within the facility; wrong site/side/patient/procedure/implant event; or a hospital transfer or hospital admission upon discharge from the facility. (G8907) Patient did not have preoperative order for IV antibiotic SSI prophylaxis. (G8918)  

## 2012-04-19 ENCOUNTER — Telehealth: Payer: Self-pay | Admitting: *Deleted

## 2012-04-19 NOTE — Telephone Encounter (Signed)
   Follow up Call-  Call back number 04/17/2012  Post procedure Call Back phone  # [phone]  Permission to leave phone message Yes     Patient questions:  Do you have a fever, pain , or abdominal swelling? no Pain Score  0 *  Have you tolerated food without any problems? yes  Have you been able to return to your normal activities? yes  Do you have any questions about your discharge instructions: Diet   no Medications  no Follow up visit  no  Do you have questions or concerns about your Care? no  Actions: * If pain score is 4 or above: No action needed, pain <4.

## 2012-04-24 ENCOUNTER — Ambulatory Visit: Payer: Medicare Other | Admitting: Gastroenterology

## 2012-05-15 ENCOUNTER — Encounter: Payer: Self-pay | Admitting: Gastroenterology

## 2012-05-15 ENCOUNTER — Ambulatory Visit (INDEPENDENT_AMBULATORY_CARE_PROVIDER_SITE_OTHER): Payer: Medicare Other | Admitting: Gastroenterology

## 2012-05-15 VITALS — BP 140/72 | HR 72 | Ht 71.0 in | Wt 198.8 lb

## 2012-05-15 DIAGNOSIS — Z86010 Personal history of colonic polyps: Secondary | ICD-10-CM

## 2012-05-15 DIAGNOSIS — R1032 Left lower quadrant pain: Secondary | ICD-10-CM

## 2012-05-15 DIAGNOSIS — K219 Gastro-esophageal reflux disease without esophagitis: Secondary | ICD-10-CM

## 2012-05-15 NOTE — Patient Instructions (Addendum)
Patient advised to avoid spicy, acidic, citrus, chocolate, mints, fruit and fruit juices.  Limit the intake of caffeine, alcohol and Soda.  Don't exercise too soon after eating.  Don't lie down within 3-4 hours of eating.  Elevate the head of your bed.  Continue to take Nexium one tablet by mouth every day 30 minutes before breakfast.  Modify your High Fiber diet as needed.  Follow up as needed.   cc: Joycelyn Rua, MD

## 2012-05-15 NOTE — Progress Notes (Signed)
History of Present Illness: This is a 72 year old male with recurrent left lower quadrant pain and substernal soreness. He states his left lower quadrant pain is 70-80% better since beginning Robinul. He feels his high fiber diet increases intestinal gas and worsens his pain. States he takes his Nexium about every other or every third day and has occasional postprandial substernal aching.   Current Medications, Allergies, Past Medical History, Past Surgical History, Family History and Social History were reviewed in [employer] record.  Physical Exam: General: Well developed , well nourished, no acute distress Head: Normocephalic and atraumatic Eyes:  sclerae anicteric, EOMI Ears: Normal auditory acuity Mouth: No deformity or lesions Lungs: Clear throughout to auscultation Heart: Regular rate and rhythm; no murmurs, rubs or bruits Abdomen: Soft, non tender and non distended. No masses, hepatosplenomegaly or hernias noted. Normal Bowel sounds Musculoskeletal: Symmetrical with no gross deformities  Pulses:  Normal pulses noted Extremities: No clubbing, cyanosis, edema or deformities noted Neurological: Alert oriented x 4, grossly nonfocal Psychological:  Alert and cooperative. Anxious.  Assessment and Recommendations:  1. LLQ pain. Diverticulosis. Symptoms controlled with glycopyrrolate. Continue glycopyrrolate twice a day. Modify high fiber diet per his preference to avoid excessive intestinal gas. May continue to use a daily probiotic he feels is beneficial. No further gastrointestinal evaluation needed at this time. Ongoing followup with his primary physician. GI followup when necessary.  2. Personal history of adenomatous colon polyps. Surveillance colonoscopy in 5 years.  3. GERD. Take Nexium 40 mg daily as prescribed. All standard antireflux measures. Use Gaviscon or Maalox as needed for breakthrough symptoms. Ongoing followup with his primary care physician. GI  followup when necessary.

## 2013-05-31 ENCOUNTER — Encounter (HOSPITAL_BASED_OUTPATIENT_CLINIC_OR_DEPARTMENT_OTHER): Payer: Self-pay | Admitting: Emergency Medicine

## 2013-05-31 ENCOUNTER — Emergency Department (HOSPITAL_BASED_OUTPATIENT_CLINIC_OR_DEPARTMENT_OTHER)
Admission: EM | Admit: 2013-05-31 | Discharge: 2013-05-31 | Disposition: A | Discharge status: Home / Self Care | Payer: Medicare Other | Attending: Emergency Medicine | Admitting: Emergency Medicine

## 2013-05-31 ENCOUNTER — Emergency Department (HOSPITAL_BASED_OUTPATIENT_CLINIC_OR_DEPARTMENT_OTHER): Payer: Medicare Other

## 2013-05-31 DIAGNOSIS — K219 Gastro-esophageal reflux disease without esophagitis: Secondary | ICD-10-CM | POA: Insufficient documentation

## 2013-05-31 DIAGNOSIS — I10 Essential (primary) hypertension: Secondary | ICD-10-CM | POA: Insufficient documentation

## 2013-05-31 DIAGNOSIS — Z79899 Other long term (current) drug therapy: Secondary | ICD-10-CM | POA: Insufficient documentation

## 2013-05-31 DIAGNOSIS — R0789 Other chest pain: Secondary | ICD-10-CM | POA: Insufficient documentation

## 2013-05-31 DIAGNOSIS — R079 Chest pain, unspecified: Secondary | ICD-10-CM

## 2013-05-31 DIAGNOSIS — Z860100 Personal history of colon polyps, unspecified: Secondary | ICD-10-CM | POA: Insufficient documentation

## 2013-05-31 DIAGNOSIS — Z87891 Personal history of nicotine dependence: Secondary | ICD-10-CM | POA: Insufficient documentation

## 2013-05-31 DIAGNOSIS — Z7982 Long term (current) use of aspirin: Secondary | ICD-10-CM | POA: Insufficient documentation

## 2013-05-31 DIAGNOSIS — Z8739 Personal history of other diseases of the musculoskeletal system and connective tissue: Secondary | ICD-10-CM | POA: Insufficient documentation

## 2013-05-31 LAB — CBC WITH DIFFERENTIAL/PLATELET
BASOS ABS: 0 10*3/uL (ref 0.0–0.1)
BASOS PCT: 0 % (ref 0–1)
Eosinophils Absolute: 0.1 10*3/uL (ref 0.0–0.7)
Eosinophils Relative: 1 % (ref 0–5)
HEMATOCRIT: 42.1 % (ref 39.0–52.0)
Hemoglobin: 14.4 g/dL (ref 13.0–17.0)
LYMPHS PCT: 31 % (ref 12–46)
Lymphs Abs: 2.4 10*3/uL (ref 0.7–4.0)
MCH: 29.7 pg (ref 26.0–34.0)
MCHC: 34.2 g/dL (ref 30.0–36.0)
MCV: 86.8 fL (ref 78.0–100.0)
Monocytes Absolute: 0.7 10*3/uL (ref 0.1–1.0)
Monocytes Relative: 10 % (ref 3–12)
NEUTROS ABS: 4.4 10*3/uL (ref 1.7–7.7)
NEUTROS PCT: 58 % (ref 43–77)
PLATELETS: 200 10*3/uL (ref 150–400)
RBC: 4.85 MIL/uL (ref 4.22–5.81)
RDW: 13.3 % (ref 11.5–15.5)
WBC: 7.6 10*3/uL (ref 4.0–10.5)

## 2013-05-31 LAB — POCT I-STAT TROPONIN I
TROPONIN I, POC: 0.02 ng/mL (ref 0.00–0.08)
Troponin i, poc: 0.02 ng/mL (ref 0.00–0.08)

## 2013-05-31 LAB — BASIC METABOLIC PANEL
BUN: 24 mg/dL — ABNORMAL HIGH (ref 6–23)
CHLORIDE: 105 meq/L (ref 96–112)
CO2: 25 meq/L (ref 19–32)
CREATININE: 0.9 mg/dL (ref 0.50–1.35)
Calcium: 9.5 mg/dL (ref 8.4–10.5)
GFR calc non Af Amer: 82 mL/min — ABNORMAL LOW (ref >90)
Glucose, Bld: 101 mg/dL — ABNORMAL HIGH (ref 70–99)
POTASSIUM: 3.7 meq/L (ref 3.7–5.3)
SODIUM: 143 meq/L (ref 137–147)

## 2013-05-31 MED ORDER — GI COCKTAIL ~~LOC~~
30.0000 mL | Freq: Once | ORAL | Status: AC
  Administered 2013-05-31: 30 mL via ORAL
  Filled 2013-05-31: qty 30

## 2013-05-31 MED ORDER — ASPIRIN 81 MG PO CHEW
324.0000 mg | CHEWABLE_TABLET | Freq: Once | ORAL | Status: AC
  Administered 2013-05-31: 324 mg via ORAL
  Filled 2013-05-31: qty 4

## 2013-05-31 NOTE — Discharge Instructions (Signed)
Follow up with Cardiology for further evaluation. Refer to attached documents for more information. Return to the ED with worsening or concerning symptoms.

## 2013-05-31 NOTE — ED Provider Notes (Signed)
CSN: [id number]     Arrival date & time 05/31/13  1615 History   First MD Initiated Contact with Patient 05/31/13 1649     Chief Complaint  Patient presents with  . Chest Pain     (Consider location/radiation/quality/duration/timing/severity/associated sxs/prior Treatment) HPI Comments: Patient is a 73 year old male with a past medical history of hypertension and GERD who presents with chest pain that started 3 hours prior to arrival. Symptoms started gradually and remained constant since the onset. The pain feels like a tightness with occasional sharp pains and is moderate in severity. The pain does not radiate. Patient denies associated symptoms. Deep inspiration makes the pain worse. No alleviating factors. Patient reports doing heavy lifting yesterday and thinks the pain may be muscle tightness. Patient denies any previous MI.   Patient is a 73 y.o. male presenting with chest pain.  Chest Pain Associated symptoms: no abdominal pain, no dizziness, no dysphagia, no fatigue, no fever, no nausea, no palpitations, no shortness of breath, not vomiting and no weakness     Past Medical History  Diagnosis Date  . Hypertension   . Diverticulosis   . GERD (gastroesophageal reflux disease)   . Hemorrhoids   . Arthritis   . History of colon polyps    Past Surgical History  Procedure Laterality Date  . Hernia repair    . Colonoscopy    . Polypectomy    . Replacement total knee  02/14/11    left knee   Family History  Problem Relation Age of Onset  . Breast cancer Sister   . Pancreatic cancer Brother    History  Substance Use Topics  . Smoking status: Former Smoker    Types: Cigarettes    Quit date: 02/21/1964  . Smokeless tobacco: Never Used  . Alcohol Use: No    Review of Systems  Constitutional: Negative for fever, chills and fatigue.  HENT: Negative for trouble swallowing.   Eyes: Negative for visual disturbance.  Respiratory: Negative for shortness of breath.    Cardiovascular: Positive for chest pain. Negative for palpitations.  Gastrointestinal: Negative for nausea, vomiting, abdominal pain and diarrhea.  Genitourinary: Negative for dysuria and difficulty urinating.  Musculoskeletal: Negative for arthralgias and neck pain.  Skin: Negative for color change.  Neurological: Negative for dizziness and weakness.  Psychiatric/Behavioral: Negative for dysphoric mood.      Allergies  Codeine  Home Medications   Current Outpatient Rx  Name  Route  Sig  Dispense  Refill  . aspirin 81 MG tablet   Oral   Take 81 mg by mouth daily.         . Cholecalciferol (VITAMIN D3) 2000 UNITS TABS   Oral   Take by mouth daily.         Marland Kitchen esomeprazole (NEXIUM) 40 MG capsule   Oral   Take 40 mg by mouth daily before breakfast. Pt takes one tablet every 4 days         . glycopyrrolate (ROBINUL) 2 MG tablet   Oral   Take 1 tablet (2 mg total) by mouth 2 (two) times daily.   60 tablet   11   . hydrochlorothiazide (HYDRODIURIL) 25 MG tablet   Oral   Take 12.5 mg by mouth daily.          Marland Kitchen losartan (COZAAR) 100 MG tablet   Oral   Take 100 mg by mouth daily.         . Multiple Vitamin (MULTIVITAMIN) tablet  Oral   Take 1 tablet by mouth daily.         . vitamin E (VITAMIN E) 400 UNIT capsule   Oral   Take 400 Units by mouth daily.         Marland Kitchen zinc gluconate 50 MG tablet   Oral   Take 50 mg by mouth daily.          BP 162/92  Pulse 66  Temp(Src) 98.1 F (36.7 C) (Oral)  Resp 18  Ht 5\' 11"  (1.803 m)  Wt 197 lb (89.359 kg)  BMI 27.49 kg/m2  SpO2 97% Physical Exam  Nursing note and vitals reviewed. Constitutional: He is oriented to person, place, and time. He appears well-developed and well-nourished. No distress.  HENT:  Head: Normocephalic and atraumatic.  Eyes: Conjunctivae and EOM are normal.  Neck: Normal range of motion.  Cardiovascular: Normal rate and regular rhythm.  Exam reveals no gallop and no friction rub.    No murmur heard. Pulmonary/Chest: Effort normal and breath sounds normal. He has no wheezes. He has no rales. He exhibits tenderness.  Mild central chest tenderness to palpation.   Abdominal: Soft. There is no tenderness.  Musculoskeletal: Normal range of motion.  Neurological: He is alert and oriented to person, place, and time. Coordination normal.  Speech is goal-oriented. Moves limbs without ataxia.   Skin: Skin is warm and dry.  Psychiatric: He has a normal mood and affect. His behavior is normal.    ED Course  Procedures (including critical care time) Labs Review Labs Reviewed  BASIC METABOLIC PANEL - Abnormal; Notable for the following:    Glucose, Bld 101 (*)    BUN 24 (*)    GFR calc non Af Amer 82 (*)    All other components within normal limits  CBC WITH DIFFERENTIAL  POCT I-STAT TROPONIN I  POCT I-STAT TROPONIN I   Imaging Review Dg Chest 2 View  05/31/2013   CLINICAL DATA:  Left-sided chest pain.  EXAM: CHEST  2 VIEW  COMPARISON:  Chest x-ray 02/03/2011.  FINDINGS: Lung volumes are normal. No consolidative airspace disease. No pleural effusions. No pneumothorax. No pulmonary nodule or mass noted. Pulmonary vasculature and the cardiomediastinal silhouette are within normal limits.  IMPRESSION: 1.  No radiographic evidence of acute cardiopulmonary disease.   Electronically Signed   By: Vinnie Langton M.D.   On: 05/31/2013 18:23    EKG Interpretation   None       MDM   Final diagnoses:  Chest pain    6:56 PM Labs and troponin unremarkable. EKG shows NSR without acute changes. Vitals stable and patient afebrile.   9:32 PM Chest xray unremarkable for acute changes. Patient's delta troponin negative for changes. Patient's chest pain has improved. I spoke with Dr. Frances Furbish who is on call for cardiology tonight who feels the patient could be discharged or admitted to hospitalist for chest pain rule out based on our judgement. Patient would like to go home tonight  and follow up as an outpatient. Patient understands the risks of going home. Patient will return to the ED with worsening or concerning symptoms.   Alvina Chou, PA-C 05/31/13 2135

## 2013-05-31 NOTE — ED Notes (Signed)
Left sided/central cp started 3 hrs ago.  Tightness.  No radiation.  Denies nausea. Reports hx of gerd but sts it feels a little different.

## 2013-05-31 NOTE — ED Notes (Signed)
Patient transported to X-ray 

## 2013-06-01 NOTE — ED Provider Notes (Signed)
Medical screening examination/treatment/procedure(s) were conducted as a shared visit with non-physician practitioner(s) and myself.  I personally evaluated the patient during the encounter.  EKG Interpretation    Date/Time:  Friday May 31 2013 16:24:23 EST Ventricular Rate:  74 PR Interval:  228 QRS Duration: 92 QT Interval:  348 QTC Calculation: 386 R Axis:   35 Text Interpretation:  Sinus rhythm with 1st degree A-V block Otherwise normal ECG No significant change was found Confirmed by [facility]  MD, TREY ([id number]) on 05/31/2013 8:36:28 PM              Houston Siren III, MD 06/01/13 1500

## 2013-06-01 NOTE — ED Provider Notes (Signed)
Medical screening examination/treatment/procedure(s) were conducted as a shared visit with non-physician practitioner(s) and myself.  I personally evaluated the patient during the encounter.  EKG Interpretation    Date/Time:  Friday May 31 2013 16:24:23 EST Ventricular Rate:  74 PR Interval:  228 QRS Duration: 92 QT Interval:  348 QTC Calculation: 386 R Axis:   35 Text Interpretation:  Sinus rhythm with 1st degree A-V block Otherwise normal ECG No significant change was found Confirmed by [facility]  MD, TREY ([id number]) on 05/31/2013 8:36:28 PM            73 yo male with central chest pain which began a few hours ago.  He reports heavy lifting yesterday.  No nausea, diaphoresis, SOB, or light headedness.  Pain doesn't radiate.  On exam, pain reproducible with palpation and twisting torso.  II/VI systolic murmur appreciated with RRR.  Lungs CTAB.  His clinical picture is very suspicious for MSK chest pain.  Two troponin's were negative.  [facility] discussed case with Cardiologist due to apparent new murmur.  Ultimate plan was for discharge home with follow up.  Clinical Impression: 1. Chest pain       Houston Siren III, MD 06/01/13 1500

## 2013-06-20 ENCOUNTER — Telehealth: Payer: Self-pay | Admitting: Gastroenterology

## 2013-06-20 NOTE — Telephone Encounter (Signed)
Patient with reflux and would like to have a EGD for his recurrent CP.  He is scheduled with Dr. Fuller Plan on 07/08/13 9:15.  Patient instructed to maintain an anti-reflux diet. Advised to avoid caffeine, mint, citrus foods/juices, tomatoes,  chocolate, NSAIDS/ASA products.  Instructed not to eat within 2 hours of exercise or bed, multiple small meals are better than 3 large meals.  Need to take PPI 30 minutes prior to 1st meal of the day. He will call back for worsening symptoms

## 2013-07-08 ENCOUNTER — Ambulatory Visit (INDEPENDENT_AMBULATORY_CARE_PROVIDER_SITE_OTHER): Payer: Medicare Other | Admitting: Gastroenterology

## 2013-07-08 ENCOUNTER — Encounter: Payer: Self-pay | Admitting: Gastroenterology

## 2013-07-08 VITALS — BP 148/72 | HR 80 | Ht 70.0 in | Wt 197.4 lb

## 2013-07-08 DIAGNOSIS — R079 Chest pain, unspecified: Secondary | ICD-10-CM

## 2013-07-08 DIAGNOSIS — K219 Gastro-esophageal reflux disease without esophagitis: Secondary | ICD-10-CM

## 2013-07-08 MED ORDER — ESOMEPRAZOLE MAGNESIUM 40 MG PO CPDR: 40.0000 mg | DELAYED_RELEASE_CAPSULE | Freq: Two times a day (BID) | ORAL | Status: DC

## 2013-07-08 NOTE — Patient Instructions (Addendum)
You have been scheduled for an endoscopy with propofol. Please follow written instructions given to you at your visit today. If you use inhalers (even only as needed), please bring them with you on the day of your procedure.  We have sent the following medications to your pharmacy for you to pick up at your convenience: Nexium we increased to 40 twice daily.   Thank you for choosing me and [facility].  Pricilla Riffle. Dagoberto Ligas., MD., Marval Regal  cc: Christella Noa, MD

## 2013-07-08 NOTE — Progress Notes (Signed)
    History of Present Illness: This is a 73 year old male accompanied by his wife. He relates several months of recurrent chest pain. He occasionally has heartburn type symptoms in his mid substernal area but also has sharp pains in his left and right anterior chest and tightness across his chest that is frequently exacerbated by movement. He was seen in the emergency department for worsening chest pain following heavy lifting which was felt to be musculoskeletal. His Nexium was recently increased to twice daily and notes his heartburn and mid substernal chest pain has improved. Denies weight loss, abdominal pain, constipation, diarrhea, change in stool caliber, melena, hematochezia, nausea, vomiting, dysphagia.  Current Medications, Allergies, Past Medical History, Past Surgical History, Family History and Social History were reviewed in [employer] record.  Physical Exam: General: Well developed , well nourished, no acute distress Head: Normocephalic and atraumatic Eyes:  sclerae anicteric, EOMI Ears: Normal auditory acuity Mouth: No deformity or lesions Lungs: Clear throughout to auscultation, no chest wall tenderness Heart: Regular rate and rhythm; no murmurs, rubs or bruits Abdomen: Soft, non tender and non distended. No masses, hepatosplenomegaly or hernias noted. Normal Bowel sounds Musculoskeletal: Symmetrical with no gross deformities  Pulses:  Normal pulses noted Extremities: No clubbing, cyanosis, edema or deformities noted Neurological: Alert oriented x 4, grossly nonfocal Psychological:  Alert and cooperative. Anxious.  Assessment and Recommendations:  1. Chest pain, multifactorial. One component appears to be related to GERD. Continue Nexium 40 mg twice daily and standard antireflux measures. Rule out esophagitis, ulcer, UGI neoplasm. Schedule endoscopy further evaluation. The risks, benefits, and alternatives to endoscopy with possible biopsy and possible  dilation were discussed with the patient and they consent to proceed. Other components of his chest pain are not GI related, possibly musculoskeletal pain and anxiety, and I recommended that he followup with his primary physician.

## 2013-07-09 ENCOUNTER — Ambulatory Visit (HOSPITAL_COMMUNITY): Payer: Medicare Other | Attending: Cardiology | Admitting: Cardiology

## 2013-07-09 DIAGNOSIS — R011 Cardiac murmur, unspecified: Secondary | ICD-10-CM | POA: Insufficient documentation

## 2013-07-09 DIAGNOSIS — R079 Chest pain, unspecified: Secondary | ICD-10-CM

## 2013-07-09 NOTE — Progress Notes (Signed)
Echo completed

## 2013-07-10 ENCOUNTER — Telehealth: Payer: Self-pay | Admitting: Gastroenterology

## 2013-07-10 NOTE — Telephone Encounter (Signed)
OK to keep EGD appt unless he feels poorly and wants to reschedule.  If he does reschedule please to move later afternoon patient into this slot and I can keep start at 0830.

## 2013-07-10 NOTE — Telephone Encounter (Signed)
Patient notified.  He wants to keep appt for Friday

## 2013-07-10 NOTE — Telephone Encounter (Signed)
Patient with root canal this week and infection at site.  Dentist has put him on prednisone.  He is scheduled for EGD for this Friday for you.  OK to keep appt?

## 2013-07-11 ENCOUNTER — Encounter: Payer: Self-pay | Admitting: Gastroenterology

## 2013-07-12 ENCOUNTER — Ambulatory Visit (AMBULATORY_SURGERY_CENTER): Payer: Medicare Other | Admitting: Gastroenterology

## 2013-07-12 ENCOUNTER — Encounter: Payer: Self-pay | Admitting: Gastroenterology

## 2013-07-12 VITALS — BP 159/84 | HR 64 | Temp 97.9°F | Resp 14 | Ht 70.0 in | Wt 197.0 lb

## 2013-07-12 DIAGNOSIS — K229 Disease of esophagus, unspecified: Secondary | ICD-10-CM

## 2013-07-12 DIAGNOSIS — K219 Gastro-esophageal reflux disease without esophagitis: Secondary | ICD-10-CM

## 2013-07-12 DIAGNOSIS — K2940 Chronic atrophic gastritis without bleeding: Secondary | ICD-10-CM

## 2013-07-12 DIAGNOSIS — K2970 Gastritis, unspecified, without bleeding: Secondary | ICD-10-CM

## 2013-07-12 DIAGNOSIS — K2270 Barrett's esophagus without dysplasia: Secondary | ICD-10-CM

## 2013-07-12 DIAGNOSIS — R079 Chest pain, unspecified: Secondary | ICD-10-CM

## 2013-07-12 DIAGNOSIS — K2990 Gastroduodenitis, unspecified, without bleeding: Secondary | ICD-10-CM

## 2013-07-12 MED ORDER — SODIUM CHLORIDE 0.9 % IV SOLN: 500.0000 mL | INTRAVENOUS | Status: DC

## 2013-07-12 NOTE — Progress Notes (Signed)
Recovery initiated in procedure room at 0941.

## 2013-07-12 NOTE — Progress Notes (Signed)
No complaints noted in the recovery room. Maw   

## 2013-07-12 NOTE — Patient Instructions (Signed)
YOU HAD AN ENDOSCOPIC PROCEDURE TODAY AT THE [facility]: Refer to the procedure report that was given to you for any specific questions about what was found during the examination.  If the procedure report does not answer your questions, please call your gastroenterologist to clarify.  If you requested that your care partner not be given the details of your procedure findings, then the procedure report has been included in a sealed envelope for you to review at your convenience later.  YOU SHOULD EXPECT: Some feelings of bloating in the abdomen. Passage of more gas than usual.  Walking can help get rid of the air that was put into your GI tract during the procedure and reduce the bloating. If you had a lower endoscopy (such as a colonoscopy or flexible sigmoidoscopy) you may notice spotting of blood in your stool or on the toilet paper. If you underwent a bowel prep for your procedure, then you may not have a normal bowel movement for a few days.  DIET: Your first meal following the procedure should be a light meal and then it is ok to progress to your normal diet.  A half-sandwich or bowl of soup is an example of a good first meal.  Heavy or fried foods are harder to digest and may make you feel nauseous or bloated.  Likewise meals heavy in dairy and vegetables can cause extra gas to form and this can also increase the bloating.  Drink plenty of fluids but you should avoid alcoholic beverages for 24 hours.  ACTIVITY: Your care partner should take you home directly after the procedure.  You should plan to take it easy, moving slowly for the rest of the day.  You can resume normal activity the day after the procedure however you should NOT DRIVE or use heavy machinery for 24 hours (because of the sedation medicines used during the test).    SYMPTOMS TO REPORT IMMEDIATELY: A gastroenterologist can be reached at any hour.  During normal business hours, 8:30 AM to 5:00 PM Monday through Friday,  call [phone].  After hours and on weekends, please call the GI answering service at [phone] who will take a message and have the physician on call contact you.     Following upper endoscopy (EGD)  Vomiting of blood or coffee ground material  New chest pain or pain under the shoulder blades  Painful or persistently difficult swallowing  New shortness of breath  Fever of 100F or higher  Black, tarry-looking stools  FOLLOW UP: If any biopsies were taken you will be contacted by phone or by letter within the next 1-3 weeks.  Call your gastroenterologist if you have not heard about the biopsies in 3 weeks.  Our staff will call the home number listed on your records the next business day following your procedure to check on you and address any questions or concerns that you may have at that time regarding the information given to you following your procedure. This is a courtesy call and so if there is no answer at the home number and we have not heard from you through the emergency physician on call, we will assume that you have returned to your regular daily activities without incident.  SIGNATURES/CONFIDENTIALITY: You and/or your care partner have signed paperwork which will be entered into your electronic medical record.  These signatures attest to the fact that that the information above on your After Visit Summary has been reviewed and is understood.    Full responsibility of the confidentiality of this discharge information lies with you and/or your care-partner.    Await biopsy results. Handouts were given to your care partner on GERD, Hiatal Hernia and gastritis. Continue taking acid reducer medications 2 x per day.  You may resume your current medications today. Return to PCP for further evaluation of chest pain.  GERD and gastritis do not fully explain his chest pain symptoms. Please call if any questions or concerns.

## 2013-07-12 NOTE — Progress Notes (Signed)
Stable to RR 

## 2013-07-12 NOTE — Op Note (Signed)
[place]  [address]. [place], [address]   ENDOSCOPY PROCEDURE REPORT  PATIENT: [name], Samuel  MR#: [id number] BIRTHDATE: [birth year] , 22  yrs. old GENDER: Male ENDOSCOPIST: Ladene Artist, MD, [facility] PROCEDURE DATE:  07/12/2013 PROCEDURE:  EGD w/ biopsy ASA CLASS:     Class II INDICATIONS:  Chest pain.   History of esophageal reflux. MEDICATIONS: MAC sedation, administered by CRNA and propofol (Diprivan) 100mg  IV TOPICAL ANESTHETIC: none DESCRIPTION OF PROCEDURE: After the risks benefits and alternatives of the procedure were thoroughly explained, informed consent was obtained.  The LB [id number] endoscope was introduced through the mouth and advanced to the second portion of the duodenum. Without limitations.  The instrument was slowly withdrawn as the mucosa was fully examined.  ESOPHAGUS: A variable Z-line was observed 40 cm from the incisors. Multiple biopsies were performed.   The esophagus was otherwise normal. STOMACH: Mild non gastritis was found in the gastric antrum, gastric body, and gastric fundus.  Multiple biopsies were performed.   The stomach otherwise appeared normal. DUODENUM: The duodenal mucosa showed no abnormalities in the bulb and second portion of the duodenum.  Retroflexed views revealed a 4 cm hiatal hernia.     The scope was then withdrawn from the patient and the procedure completed.  COMPLICATIONS: There were no complications.  ENDOSCOPIC IMPRESSION: 1.   Variable Z-line; multiple biopsies 2.   Hiatal hernia, 4 cm 3.   Gastritis in the antrum, body, and fundus; multiple biopsies  RECOMMENDATIONS: 1.  Anti-reflux regimen long term 2.  Await pathology results 3.  Continue PPI BID 4.  Return to PCP for further evaluation. GERD and gastritis do not fully explain his chest pain symptoms.   eSigned:  Ladene Artist, MD, [facility] 07/12/2013 9:39 AM   [id number] Samuel Pel, MD

## 2013-07-12 NOTE — Progress Notes (Signed)
Called to room to assist during endoscopic procedure.  Patient ID and intended procedure confirmed with present staff. Received instructions for my participation in the procedure from the performing physician.  

## 2013-07-15 ENCOUNTER — Telehealth: Payer: Self-pay | Admitting: *Deleted

## 2013-07-15 NOTE — Telephone Encounter (Signed)
  Follow up Call-  Call back number 07/12/2013 04/17/2012  Post procedure Call Back phone  # [phone]  Permission to leave phone message Yes Yes     Patient questions:  FAX number; unable to leave message.

## 2013-07-19 ENCOUNTER — Other Ambulatory Visit: Payer: Self-pay | Admitting: Gastroenterology

## 2013-07-21 ENCOUNTER — Encounter: Payer: Self-pay | Admitting: Gastroenterology

## 2013-07-29 ENCOUNTER — Encounter: Payer: Self-pay | Admitting: Cardiology

## 2013-07-29 ENCOUNTER — Ambulatory Visit (INDEPENDENT_AMBULATORY_CARE_PROVIDER_SITE_OTHER): Payer: Medicare Other | Admitting: Cardiology

## 2013-07-29 VITALS — BP 177/94 | HR 75 | Ht 70.0 in | Wt 195.0 lb

## 2013-07-29 DIAGNOSIS — R079 Chest pain, unspecified: Secondary | ICD-10-CM | POA: Insufficient documentation

## 2013-07-29 DIAGNOSIS — I10 Essential (primary) hypertension: Secondary | ICD-10-CM

## 2013-07-29 NOTE — Progress Notes (Signed)
HPI: 73 year old male for evaluation of chest pain. Seen in emergency room in February of 2015 with chest pain. Chest x-ray negative. Troponin and hemoglobin normal. Echocardiogram in March of 2015 showed normal LV function, grade 1 diastolic dysfunction, mild left atrial enlargement and mild mitral regurgitation. Probable liver cyst and ultrasound recommended. He had a followup ultrasound recently by his report which showed cysts. Patient states that he has an occasional sharp chest pain for seconds. He has noticed dyspnea with more vigorous activities but not routine activities. No orthopnea, PND or pedal edema. No exertional chest pain. His bout of chest pain in February Occurred the following day after moving heavy logs. It increases with certain movements. No radiation or associated symptoms. He had another episode after working hard approximately 2 weeks ago.  Current Outpatient Prescriptions  Medication Sig Dispense Refill  . aspirin 81 MG tablet Take 81 mg by mouth daily.      . Cholecalciferol (VITAMIN D3) 2000 UNITS TABS Take by mouth daily.      . Coenzyme Q10 (COQ-10) 100 MG CAPS Take 1 capsule by mouth daily.      Marland Kitchen esomeprazole (NEXIUM) 40 MG capsule Take 1 capsule (40 mg total) by mouth 2 (two) times daily before a meal. Pt takes one tablet every 4 days  60 capsule  11  . glycopyrrolate (ROBINUL) 2 MG tablet TAKE 1 TABLET (2 MG TOTAL) BY MOUTH 2 (TWO) TIMES DAILY.  60 tablet  5  . hydrochlorothiazide (HYDRODIURIL) 25 MG tablet Take 12.5 mg by mouth daily.       Marland Kitchen losartan (COZAAR) 100 MG tablet Take 100 mg by mouth daily.      . Multiple Vitamin (MULTIVITAMIN) tablet Take 1 tablet by mouth daily.      . vitamin B-12 (CYANOCOBALAMIN) 1000 MCG tablet Take 1,000 mcg by mouth daily.      . vitamin E (VITAMIN E) 400 UNIT capsule Take 400 Units by mouth daily.      . Zinc 50 MG TABS Take 1 tablet by mouth daily.       No current facility-administered medications for this visit.     Allergies  Allergen Reactions  . Codeine     REACTION: Agitation    Past Medical History  Diagnosis Date  . Hypertension   . Diverticulosis   . GERD (gastroesophageal reflux disease)   . Hemorrhoids   . Arthritis   . Tubular adenoma of colon 01/2009  . Hiatal hernia   . Heart murmur     Past Surgical History  Procedure Laterality Date  . Hernia repair    . Colonoscopy    . Polypectomy    . Replacement total knee  02/14/11    left knee    History   Social History  . Marital Status: Married    Spouse Name: N/A    Number of Children: 4  . Years of Education: N/A   Occupational History  . Not on file.   Social History Main Topics  . Smoking status: Former Smoker    Types: Cigarettes    Quit date: 02/21/1964  . Smokeless tobacco: Never Used  . Alcohol Use: No  . Drug Use: No  . Sexual Activity: Not on file   Other Topics Concern  . Not on file   Social History Narrative  . No narrative on file    Family History  Problem Relation Age of Onset  . Breast cancer Sister   . Pancreatic  cancer Brother   . Colon cancer Neg Hx   . Esophageal cancer Neg Hx   . Rectal cancer Neg Hx   . Stomach cancer Neg Hx     ROS: no fevers or chills, productive cough, hemoptysis, dysphasia, odynophagia, melena, hematochezia, dysuria, hematuria, rash, seizure activity, orthopnea, PND, pedal edema, claudication. Remaining systems are negative.  Physical Exam:   Blood pressure 177/94, pulse 75, height 5\' 10"  (1.778 m), weight 195 lb (88.451 kg).  General:  Well developed/well nourished in NAD Skin warm/dry Patient not depressed No peripheral clubbing Back-normal HEENT-normal/normal eyelids Neck supple/normal carotid upstroke bilaterally; no bruits; no JVD; no thyromegaly chest - CTA/ normal expansion CV - RRR/normal S1 and S2; no rubs or gallops;  PMI nondisplaced, 2/6 systolic murmur left sternal border. Abdomen -NT/ND, no HSM, no mass, + bowel sounds, no  bruit 2+ femoral pulses, no bruits Ext-no edema, chords, 2+ DP Neuro-grossly nonfocal  ECG February 2015-sinus rhythm, first degree AV block, no ST changes. Today's electrocardiogram reveals sinus rhythm, first degree AV block, no ST changes.

## 2013-07-29 NOTE — Assessment & Plan Note (Signed)
Blood pressure is elevated. However he states it is typically controlled. Continue to follow at home and add additional medications as needed.

## 2013-07-29 NOTE — Patient Instructions (Signed)
Your physician recommends that you schedule a follow-up appointment in: AS NEEDED PENDING TEST RESULTS  Your physician has requested that you have an exercise tolerance test. For further information please visit [email or web address]. Please also follow instruction sheet, as given.    Exercise Stress Electrocardiogram An exercise stress electrocardiogram is a test to check how blood flows to your heart. It is done to find areas of poor blood flow. You will need to walk on a treadmill for this test. The electrocardiogram will record your heartbeat when you are at rest and when you are exercising. BEFORE THE PROCEDURE  Do not have drinks with caffeine or foods with caffeine for 24 hours before the test, or as told by your doctor.  Follow your doctor's instructions about eating and drinking before the test.  Ask your doctor what medicines you should or should not take before the test. Take your medicines with water unless told by your doctor not to.  If you use an inhaler, bring it with you to the test.  Bring a snack to eat after the test.  Do not  smoke for 4 hours before the test.  Wear comfortable shoes and clothing. PROCEDURE  You will have patches put on your chest. Small areas of your chest may need to be shaved. Wires will be connected to the patches.  Your heart rate will be watched while you are resting and while you are exercising.  You will walk on the treadmill. The treadmill will slowly get faster to raise your heart rate.  The test will take about 1 2 hours. AFTER THE PROCEDURE  Your heart rate and blood pressure will be watched after the test.  You may return to your normal diet, activities, and medicines or as told by your doctor. Document Released: 09/21/2007 Document Revised: 01/23/2013 Document Reviewed: 12/10/2012 ExitCare Patient Information 2014 [employer].   

## 2013-07-29 NOTE — Assessment & Plan Note (Signed)
Symptoms are atypical possibly musculoskeletal. Echocardiogram showed normal LV function. Electrocardiogram shows no ST changes. Plan exercise treadmill for risk stratification.

## 2013-08-28 ENCOUNTER — Encounter: Payer: Self-pay | Admitting: Nurse Practitioner

## 2013-08-28 ENCOUNTER — Ambulatory Visit (INDEPENDENT_AMBULATORY_CARE_PROVIDER_SITE_OTHER): Payer: Medicare Other | Admitting: Nurse Practitioner

## 2013-08-28 VITALS — BP 163/95 | HR 83

## 2013-08-28 DIAGNOSIS — R079 Chest pain, unspecified: Secondary | ICD-10-CM

## 2013-08-28 DIAGNOSIS — I10 Essential (primary) hypertension: Secondary | ICD-10-CM

## 2013-08-28 NOTE — Progress Notes (Signed)
Exercise Treadmill Test  Pre-Exercise Testing Evaluation Rhythm: normal sinus  Rate: 72 bpm     Test  Exercise Tolerance Test Ordering MD: Kirk Ruths, MD  Interpreting MD: Truitt Merle, NP  Unique Test No: 1  Treadmill:  1  Indication for ETT: chest pain - rule out ischemia  Contraindication to ETT: No   Stress Modality: exercise - treadmill  Cardiac Imaging Performed: non   Protocol: standard Bruce - maximal  Max BP:  216/84  Max MPHR (bpm):  145 85% MPR (bpm):  123  MPHR obtained (bpm):  148 % MPHR obtained:  102%  Reached 85% MPHR (min:sec):  3:00 Total Exercise Time (min-sec):  6 minutes  Workload in METS:  7.0 Borg Scale: 15  Reason ETT Terminated:  patient's desire to stop    ST Segment Analysis At Rest: normal ST segments - no evidence of significant ST depression With Exercise: no evidence of significant ST depression  Other Information Arrhythmia:  No Angina during ETT:  absent (0) Quality of ETT:  diagnostic  ETT Interpretation:  normal - no evidence of ischemia by ST analysis  Comments: Patient presents today for routine GXT. Has had atypical chest pain. Had an ER visit back in February of 2015. Has HTN. Has done fine since being on PPI therapy. No further chest pain. [place].  Today the patient exercised on the standard Bruce protocol for a total of 6 minutes.  Fair exercise tolerance.  Adequate blood pressure response.  Clinically negative for chest pain. Test was stopped due to leg fatigue/achievement of target HR.  EKG negative for ischemia. No significant arrhythmia noted. Occasional PVCs and PACs noted. Rare couplet noted.  Recommendations: CV risk factor modification.  See back as needed.  Patient is agreeable to this plan and will call if any problems develop in the interim.   Burtis Junes, RN, [facility] [address] [place], [place]  [address] [phone]

## 2014-08-04 ENCOUNTER — Telehealth: Payer: Self-pay | Admitting: Gastroenterology

## 2014-08-04 ENCOUNTER — Other Ambulatory Visit: Payer: Self-pay | Admitting: Gastroenterology

## 2014-08-04 NOTE — Telephone Encounter (Signed)
Prescription sent to patient's pharmacy. Attempted to contact patient to notify him but no answer and could leave a message.

## 2014-10-04 ENCOUNTER — Other Ambulatory Visit: Payer: Self-pay | Admitting: Gastroenterology

## 2014-10-08 ENCOUNTER — Encounter: Payer: Self-pay | Admitting: Gastroenterology

## 2015-01-02 ENCOUNTER — Other Ambulatory Visit: Payer: Self-pay | Admitting: Gastroenterology

## 2015-01-05 ENCOUNTER — Telehealth: Payer: Self-pay | Admitting: Gastroenterology

## 2015-01-05 MED ORDER — GLYCOPYRROLATE 2 MG PO TABS: ORAL_TABLET | ORAL | Status: DC

## 2015-01-05 NOTE — Telephone Encounter (Signed)
OK to refill until his REV

## 2015-01-05 NOTE — Telephone Encounter (Signed)
Refill sent patient's pharmacy and notified to keep appt for further refills.

## 2015-01-05 NOTE — Telephone Encounter (Signed)
Over due for follow up visit and the last refill we gave was 07/2014. Can we refill until appt?

## 2015-02-18 ENCOUNTER — Ambulatory Visit (INDEPENDENT_AMBULATORY_CARE_PROVIDER_SITE_OTHER): Payer: Medicare Other | Admitting: Gastroenterology

## 2015-02-18 ENCOUNTER — Encounter: Payer: Self-pay | Admitting: Gastroenterology

## 2015-02-18 VITALS — BP 122/76 | HR 72 | Ht 70.0 in | Wt 194.8 lb

## 2015-02-18 DIAGNOSIS — K219 Gastro-esophageal reflux disease without esophagitis: Secondary | ICD-10-CM

## 2015-02-18 DIAGNOSIS — Z86010 Personal history of colonic polyps: Secondary | ICD-10-CM | POA: Diagnosis not present

## 2015-02-18 DIAGNOSIS — K2270 Barrett's esophagus without dysplasia: Secondary | ICD-10-CM | POA: Diagnosis not present

## 2015-02-18 MED ORDER — ESOMEPRAZOLE MAGNESIUM 40 MG PO CPDR
DELAYED_RELEASE_CAPSULE | ORAL | Status: DC
Start: 2015-02-18 — End: 2015-10-27

## 2015-02-18 MED ORDER — GLYCOPYRROLATE 2 MG PO TABS: ORAL_TABLET | ORAL | Status: DC

## 2015-02-18 NOTE — Progress Notes (Signed)
    History of Present Illness: This is a 74 year old male returning for follow-up of Barrett's esophagus and IBS. He is accompanied by his wife. States his reflux symptoms are well controlled on once daily Nexium. Irritable bowel syndrome is controlled on once daily Robinul. He takes a probiotic every other day. He has no gastrointestinal complaints.  Current Medications, Allergies, Past Medical History, Past Surgical History, Family History and Social History were reviewed in [employer] record.  Physical Exam: General: Well developed, well nourished, no acute distress Head: Normocephalic and atraumatic Eyes:  sclerae anicteric, EOMI Ears: Normal auditory acuity Mouth: No deformity or lesions Lungs: Clear throughout to auscultation Heart: Regular rate and rhythm; no murmurs, rubs or bruits Abdomen: Soft, non tender and non distended. No masses, hepatosplenomegaly or hernias noted. Normal Bowel sounds Musculoskeletal: Symmetrical with no gross deformities  Pulses:  Normal pulses noted Extremities: No clubbing, cyanosis, edema or deformities noted Neurological: Alert oriented x 4, grossly nonfocal Psychological:  Alert and cooperative. Normal mood and affect  Assessment and Recommendations:  1. GERD. Barrett's esophagus. Continue esomeprazole 40 mg po qam and antireflux measures. Surveillance endoscopy recommended in March 2018.  2. Personal history of adenomatous colon polyps. Surveillance colonoscopy recommended in December 2018.  3. IBS. Continue glycopyrrolate 2 mg twice a day as needed. Continue daily probiotic if he feels that it is helping his symptoms otherwise discontinue.  I spent 15 minutes of face-to-face time with the patient. Greater than 50% of the time was spent counseling and coordinating care.

## 2015-02-18 NOTE — Patient Instructions (Signed)
We have sent the following medications to your pharmacy for you to pick up at your convenience:Nexium and robinul.  Thank you for choosing me and [facility].  Pricilla Riffle. Dagoberto Ligas., MD., Marval Regal

## 2015-05-27 ENCOUNTER — Other Ambulatory Visit: Payer: Self-pay | Admitting: Gastroenterology

## 2015-07-06 ENCOUNTER — Emergency Department (HOSPITAL_BASED_OUTPATIENT_CLINIC_OR_DEPARTMENT_OTHER): Payer: Medicare Other

## 2015-07-06 ENCOUNTER — Encounter (HOSPITAL_BASED_OUTPATIENT_CLINIC_OR_DEPARTMENT_OTHER): Payer: Self-pay

## 2015-07-06 ENCOUNTER — Emergency Department (HOSPITAL_BASED_OUTPATIENT_CLINIC_OR_DEPARTMENT_OTHER)
Admission: EM | Admit: 2015-07-06 | Discharge: 2015-07-06 | Disposition: A | Discharge status: Home / Self Care | Payer: Medicare Other | Attending: Emergency Medicine | Admitting: Emergency Medicine

## 2015-07-06 DIAGNOSIS — K219 Gastro-esophageal reflux disease without esophagitis: Secondary | ICD-10-CM | POA: Diagnosis not present

## 2015-07-06 DIAGNOSIS — S61219A Laceration without foreign body of unspecified finger without damage to nail, initial encounter: Secondary | ICD-10-CM

## 2015-07-06 DIAGNOSIS — I10 Essential (primary) hypertension: Secondary | ICD-10-CM | POA: Insufficient documentation

## 2015-07-06 DIAGNOSIS — S61211A Laceration without foreign body of left index finger without damage to nail, initial encounter: Secondary | ICD-10-CM | POA: Insufficient documentation

## 2015-07-06 DIAGNOSIS — S6992XA Unspecified injury of left wrist, hand and finger(s), initial encounter: Secondary | ICD-10-CM | POA: Diagnosis present

## 2015-07-06 DIAGNOSIS — Y998 Other external cause status: Secondary | ICD-10-CM | POA: Diagnosis not present

## 2015-07-06 DIAGNOSIS — Z87891 Personal history of nicotine dependence: Secondary | ICD-10-CM | POA: Diagnosis not present

## 2015-07-06 DIAGNOSIS — R011 Cardiac murmur, unspecified: Secondary | ICD-10-CM | POA: Insufficient documentation

## 2015-07-06 DIAGNOSIS — W230XXA Caught, crushed, jammed, or pinched between moving objects, initial encounter: Secondary | ICD-10-CM | POA: Diagnosis not present

## 2015-07-06 DIAGNOSIS — Y9289 Other specified places as the place of occurrence of the external cause: Secondary | ICD-10-CM | POA: Diagnosis not present

## 2015-07-06 DIAGNOSIS — Z86018 Personal history of other benign neoplasm: Secondary | ICD-10-CM | POA: Insufficient documentation

## 2015-07-06 DIAGNOSIS — Z79899 Other long term (current) drug therapy: Secondary | ICD-10-CM | POA: Insufficient documentation

## 2015-07-06 DIAGNOSIS — M1990 Unspecified osteoarthritis, unspecified site: Secondary | ICD-10-CM | POA: Diagnosis not present

## 2015-07-06 DIAGNOSIS — Z23 Encounter for immunization: Secondary | ICD-10-CM | POA: Insufficient documentation

## 2015-07-06 DIAGNOSIS — Y9389 Activity, other specified: Secondary | ICD-10-CM | POA: Diagnosis not present

## 2015-07-06 DIAGNOSIS — Z7982 Long term (current) use of aspirin: Secondary | ICD-10-CM | POA: Insufficient documentation

## 2015-07-06 MED ORDER — AMOXICILLIN-POT CLAVULANATE 875-125 MG PO TABS: 1.0000 | ORAL_TABLET | Freq: Two times a day (BID) | ORAL | Status: DC

## 2015-07-06 MED ORDER — HYDROCODONE-ACETAMINOPHEN 5-325 MG PO TABS: 1.0000 | ORAL_TABLET | ORAL | Status: DC | PRN

## 2015-07-06 MED ORDER — LIDOCAINE HCL 2 % IJ SOLN
INTRAMUSCULAR | Status: AC
  Filled 2015-07-06: qty 20

## 2015-07-06 MED ORDER — TETANUS-DIPHTH-ACELL PERTUSSIS 5-2.5-18.5 LF-MCG/0.5 IM SUSP
0.5000 mL | Freq: Once | INTRAMUSCULAR | Status: AC
  Administered 2015-07-06: 0.5 mL via INTRAMUSCULAR
  Filled 2015-07-06: qty 0.5

## 2015-07-06 MED ORDER — CEPHALEXIN 250 MG PO CAPS
500.0000 mg | ORAL_CAPSULE | Freq: Once | ORAL | Status: AC
  Administered 2015-07-06: 500 mg via ORAL
  Filled 2015-07-06: qty 2

## 2015-07-06 MED FILL — HYDROCODON-APAP 5-325: 5-325 | 3 days supply | Qty: 20 | Fill #0

## 2015-07-06 MED FILL — AMOX-CLAV 875-125 MG TABLET: 875-125 | 7 days supply | Qty: 14 | Fill #0

## 2015-07-06 NOTE — ED Provider Notes (Addendum)
CSN: [id number]     Arrival date & time 07/06/15  1035 History   First MD Initiated Contact with Patient 07/06/15 1130     Chief Complaint  Patient presents with  . Finger Injury     (Consider location/radiation/quality/duration/timing/severity/associated sxs/prior Treatment) HPI Patient presents with, good laceration to the index finger of the left hand extending from the distal phalanx all the way to the palmar surface of the hand. States he had his hand in a gate when a calf ran into the gait. This happened roughly 30 minutes prior to presentation. Denied any numbness or tingling in the finger. He has full range of motion. Unknown last tetanus. States that the gait was rusty.  Past Medical History  Diagnosis Date  . Hypertension   . Diverticulosis   . GERD (gastroesophageal reflux disease)   . Hemorrhoids   . Arthritis   . Tubular adenoma of colon 01/2009  . Hiatal hernia   . Heart murmur    Past Surgical History  Procedure Laterality Date  . Hernia repair    . Colonoscopy    . Polypectomy    . Replacement total knee  02/14/11    left knee   Family History  Problem Relation Age of Onset  . Breast cancer Sister   . Pancreatic cancer Brother   . Colon cancer Neg Hx   . Esophageal cancer Neg Hx   . Rectal cancer Neg Hx   . Stomach cancer Neg Hx    Social History  Substance Use Topics  . Smoking status: Former Smoker    Quit date: 02/21/1964  . Smokeless tobacco: Never Used  . Alcohol Use: No    Review of Systems  Constitutional: Negative for fever and chills.  Skin: Positive for wound. Negative for rash.  Neurological: Negative for weakness and numbness.  All other systems reviewed and are negative.     Allergies  Codeine  Home Medications   Prior to Admission medications   Medication Sig Start Date End Date Taking? Authorizing Provider  amoxicillin-clavulanate (AUGMENTIN) 875-125 MG tablet Take 1 tablet by mouth 2 (two) times daily. One po bid x 7 days  07/06/15   Julianne Rice, MD  aspirin 81 MG tablet Take 81 mg by mouth daily.    Historical Provider, MD  Cholecalciferol (VITAMIN D3) 2000 UNITS TABS Take by mouth daily.    Historical Provider, MD  Coenzyme Q10 (COQ-10) 100 MG CAPS Take 1 capsule by mouth daily.    Historical Provider, MD  esomeprazole (NEXIUM) 40 MG capsule Take one tablet by mouth 30 mins before breakfast 02/18/15   Ladene Artist, MD  glycopyrrolate (ROBINUL) 2 MG tablet TAKE ONE TABLET BY MOUTH TWICE DAILY 05/27/15   Ladene Artist, MD  hydrochlorothiazide (HYDRODIURIL) 25 MG tablet Take 12.5 mg by mouth daily.     Historical Provider, MD  HYDROcodone-acetaminophen (NORCO) 5-325 MG tablet Take 1 tablet by mouth every 4 (four) hours as needed for moderate pain or severe pain. 07/06/15   Julianne Rice, MD  losartan (COZAAR) 100 MG tablet Take 100 mg by mouth daily.    Historical Provider, MD  Multiple Vitamin (MULTIVITAMIN) tablet Take 1 tablet by mouth daily.    Historical Provider, MD  vitamin B-12 (CYANOCOBALAMIN) 1000 MCG tablet Take 1,000 mcg by mouth daily.    Historical Provider, MD  vitamin E (VITAMIN E) 400 UNIT capsule Take 400 Units by mouth daily.    Historical Provider, MD  Zinc 50 MG TABS Take  1 tablet by mouth daily.    Historical Provider, MD   BP 152/94 mmHg  Pulse 65  Temp(Src) 98.4 F (36.9 C) (Oral)  Resp 18  Ht 5\' 11"  (1.803 m)  Wt 187 lb (84.823 kg)  BMI 26.09 kg/m2  SpO2 95% Physical Exam  Constitutional: He is oriented to person, place, and time. He appears well-developed and well-nourished. No distress.  HENT:  Head: Normocephalic and atraumatic.  Mouth/Throat: Oropharynx is clear and moist.  Eyes: EOM are normal. Pupils are equal, round, and reactive to light.  Neck: Normal range of motion. Neck supple.  Cardiovascular: Normal rate and regular rhythm.   Pulmonary/Chest: Effort normal and breath sounds normal.  Abdominal: Soft. Bowel sounds are normal.  Musculoskeletal: Normal range of  motion. He exhibits no edema or tenderness.  Patient has full flexion and extension of the left index finger. He has good distal cap refill.  Neurological: He is alert and oriented to person, place, and time.  Patient has full sensation over the distal tip of the finger. 5/5 intrinsics muscles of the hand.  Skin: Skin is warm and dry. No rash noted. No erythema.  Irregular laceration runs the length of the palmar surface of the left index finger area roughly 10 cm in length. There is some oozing of blood around the base of the finger.  Psychiatric: He has a normal mood and affect. His behavior is normal.  Nursing note and vitals reviewed.   ED Course  Procedures (including critical care time) Labs Review Labs Reviewed - No data to display  Imaging Review Dg Finger Index Left  07/06/2015  CLINICAL DATA:  Left index finger injury today. Finger caught in piece of equipment. EXAM: LEFT INDEX FINGER 2+V COMPARISON:  None. FINDINGS: There is evidence of soft tissue injury with prominent soft tissue emphysema in the second web space. There are scattered radiodensities overlying the soft tissues, noted radial to the distal phalanx, ulnar to the middle phalanx and within the second web space, suspicious for small foreign bodies. No evidence of acute fracture or dislocation. IMPRESSION: Soft tissue injury with soft tissue emphysema and suspected small foreign bodies. No acute osseous findings. Electronically Signed   By: Richardean Sale M.D.   On: 07/06/2015 11:48   I have personally reviewed and evaluated these images and lab results as part of my medical decision-making.   EKG Interpretation None      MDM   Final diagnoses:  Finger laceration, initial encounter    X-ray with no osseous finding spots suspected small foreign bodies. Was anesthetized with local block and thoroughly explored. It appears superficial with no tendon involvement. Extensive irrigation and soaking of the hand was done  in the emergency department. The ulceration was closed. Please see PA procedural note. Tetanus was updated. Given the risk for infection will start on antibiotics. Patient understands the need to follow-up with hand surgery.  Discussed with Dr. Grandville Silos. States his office will contact the patient to arrange follow-up. Agrees with plan for antibiotics.  Julianne Rice, MD 07/06/15 1454  Julianne Rice, MD 07/06/15 1455

## 2015-07-06 NOTE — Discharge Instructions (Signed)

## 2015-07-06 NOTE — ED Provider Notes (Signed)
LACERATION REPAIR Performed by: Renold Genta Authorized by: Jeannett Senior A Consent: Verbal consent obtained. Risks and benefits: risks, benefits and alternatives were discussed Consent given by: patient Patient identity confirmed: provided demographic data Prepped and Draped in normal sterile fashion Wound explored  Laceration Location: left index finger  Laceration Length: 10 cm  No Foreign Bodies seen or palpated  Anesthesia: digital blcok   Local anesthetic: lidocaine 2% wo epinephrine  Anesthetic total: 10 ml  Irrigation method: syringe Amount of cleaning: standard  Skin closure: prolene 5.0  Number of sutures: 20  Technique: simple interrupted  Patient tolerance: Patient tolerated the procedure well with no immediate complications.   NERVE BLOCK Performed by: Jeannett Senior A Consent: Verbal consent obtained. Required items: required blood products, implants, devices, and special equipment available Time out: Immediately prior to procedure a "time out" was called to verify the correct patient, procedure, equipment, support staff and site/side marked as required.  Indication: finger laceration Nerve block body site: left index finger  Preparation: Patient was prepped and draped in the usual sterile fashion. Needle gauge: 24 G Location technique: anatomical landmarks  Local anesthetic: lidocaine 2% wo epi  Anesthetic total: 10 ml  Outcome: pain improved Patient tolerance: Patient tolerated the procedure well with no immediate complications.     Jeannett Senior, PA-C 07/06/15 [place], MD 07/08/15 [id number]

## 2015-07-06 NOTE — ED Notes (Addendum)
Left finger caught in handle of gait-lac noted to 1/2 the  length of left index finger and extends into palm -NAD-steady gait

## 2015-09-08 ENCOUNTER — Other Ambulatory Visit (HOSPITAL_COMMUNITY): Payer: Self-pay | Admitting: Family Medicine

## 2015-09-08 DIAGNOSIS — I340 Nonrheumatic mitral (valve) insufficiency: Secondary | ICD-10-CM

## 2015-09-30 ENCOUNTER — Emergency Department (HOSPITAL_COMMUNITY)
Admission: EM | Admit: 2015-09-30 | Discharge: 2015-09-30 | Disposition: A | Discharge status: Home / Self Care | Payer: Medicare Other | Attending: Emergency Medicine | Admitting: Emergency Medicine

## 2015-09-30 ENCOUNTER — Emergency Department (HOSPITAL_COMMUNITY): Payer: Medicare Other

## 2015-09-30 ENCOUNTER — Encounter (HOSPITAL_COMMUNITY): Payer: Self-pay

## 2015-09-30 DIAGNOSIS — M1990 Unspecified osteoarthritis, unspecified site: Secondary | ICD-10-CM | POA: Insufficient documentation

## 2015-09-30 DIAGNOSIS — Z792 Long term (current) use of antibiotics: Secondary | ICD-10-CM | POA: Insufficient documentation

## 2015-09-30 DIAGNOSIS — R079 Chest pain, unspecified: Secondary | ICD-10-CM

## 2015-09-30 DIAGNOSIS — Z87891 Personal history of nicotine dependence: Secondary | ICD-10-CM | POA: Diagnosis not present

## 2015-09-30 DIAGNOSIS — Z7982 Long term (current) use of aspirin: Secondary | ICD-10-CM | POA: Diagnosis not present

## 2015-09-30 DIAGNOSIS — Z8719 Personal history of other diseases of the digestive system: Secondary | ICD-10-CM | POA: Insufficient documentation

## 2015-09-30 DIAGNOSIS — R0789 Other chest pain: Secondary | ICD-10-CM | POA: Insufficient documentation

## 2015-09-30 DIAGNOSIS — I10 Essential (primary) hypertension: Secondary | ICD-10-CM | POA: Diagnosis not present

## 2015-09-30 DIAGNOSIS — Z79899 Other long term (current) drug therapy: Secondary | ICD-10-CM | POA: Diagnosis not present

## 2015-09-30 LAB — CBC
HCT: 41 % (ref 39.0–52.0)
HEMOGLOBIN: 13.7 g/dL (ref 13.0–17.0)
MCH: 28.8 pg (ref 26.0–34.0)
MCHC: 33.4 g/dL (ref 30.0–36.0)
MCV: 86.3 fL (ref 78.0–100.0)
PLATELETS: 210 10*3/uL (ref 150–400)
RBC: 4.75 MIL/uL (ref 4.22–5.81)
RDW: 13.9 % (ref 11.5–15.5)
WBC: 7.5 10*3/uL (ref 4.0–10.5)

## 2015-09-30 LAB — BASIC METABOLIC PANEL
ANION GAP: 8 (ref 5–15)
BUN: 24 mg/dL — ABNORMAL HIGH (ref 6–20)
CALCIUM: 9.1 mg/dL (ref 8.9–10.3)
CO2: 24 mmol/L (ref 22–32)
CREATININE: 0.86 mg/dL (ref 0.61–1.24)
Chloride: 108 mmol/L (ref 101–111)
Glucose, Bld: 95 mg/dL (ref 65–99)
Potassium: 3.8 mmol/L (ref 3.5–5.1)
Sodium: 140 mmol/L (ref 135–145)

## 2015-09-30 LAB — I-STAT TROPONIN, ED: TROPONIN I, POC: 0 ng/mL (ref 0.00–0.08)

## 2015-09-30 NOTE — Discharge Instructions (Signed)
Use heat on the sore area 3 or 4 times a day. Take Tylenol as needed for pain. Return here if your symptoms worsen or you have other concerns.     Nonspecific Chest Pain  Chest pain can be caused by many different conditions. There is always a chance that your pain could be related to something serious, such as a heart attack or a blood clot in your lungs. Chest pain can also be caused by conditions that are not life-threatening. If you have chest pain, it is very important to follow up with your health care provider. CAUSES  Chest pain can be caused by:  Heartburn.  Pneumonia or bronchitis.  Anxiety or stress.  Inflammation around your heart (pericarditis) or lung (pleuritis or pleurisy).  A blood clot in your lung.  A collapsed lung (pneumothorax). It can develop suddenly on its own (spontaneous pneumothorax) or from trauma to the chest.  Shingles infection (varicella-zoster virus).  Heart attack.  Damage to the bones, muscles, and cartilage that make up your chest wall. This can include:  Bruised bones due to injury.  Strained muscles or cartilage due to frequent or repeated coughing or overwork.  Fracture to one or more ribs.  Sore cartilage due to inflammation (costochondritis). RISK FACTORS  Risk factors for chest pain may include:  Activities that increase your risk for trauma or injury to your chest.  Respiratory infections or conditions that cause frequent coughing.  Medical conditions or overeating that can cause heartburn.  Heart disease or family history of heart disease.  Conditions or health behaviors that increase your risk of developing a blood clot.  Having had chicken pox (varicella zoster). SIGNS AND SYMPTOMS Chest pain can feel like:  Burning or tingling on the surface of your chest or deep in your chest.  Crushing, pressure, aching, or squeezing pain.  Dull or sharp pain that is worse when you move, cough, or take a deep breath.  Pain  that is also felt in your back, neck, shoulder, or arm, or pain that spreads to any of these areas. Your chest pain may come and go, or it may stay constant. DIAGNOSIS Lab tests or other studies may be needed to find the cause of your pain. Your health care provider may have you take a test called an ambulatory ECG (electrocardiogram). An ECG records your heartbeat patterns at the time the test is performed. You may also have other tests, such as:  Transthoracic echocardiogram (TTE). During echocardiography, sound waves are used to create a picture of all of the heart structures and to look at how blood flows through your heart.  Transesophageal echocardiogram (TEE).This is a more advanced imaging test that obtains images from inside your body. It allows your health care provider to see your heart in finer detail.  Cardiac monitoring. This allows your health care provider to monitor your heart rate and rhythm in real time.  Holter monitor. This is a portable device that records your heartbeat and can help to diagnose abnormal heartbeats. It allows your health care provider to track your heart activity for several days, if needed.  Stress tests. These can be done through exercise or by taking medicine that makes your heart beat more quickly.  Blood tests.  Imaging tests. TREATMENT  Your treatment depends on what is causing your chest pain. Treatment may include:  Medicines. These may include:  Acid blockers for heartburn.  Anti-inflammatory medicine.  Pain medicine for inflammatory conditions.  Antibiotic medicine, if an infection  is present.  Medicines to dissolve blood clots.  Medicines to treat coronary artery disease.  Supportive care for conditions that do not require medicines. This may include:  Resting.  Applying heat or cold packs to injured areas.  Limiting activities until pain decreases. HOME CARE INSTRUCTIONS  If you were prescribed an antibiotic medicine,  finish it all even if you start to feel better.  Avoid any activities that bring on chest pain.  Do not use any tobacco products, including cigarettes, chewing tobacco, or electronic cigarettes. If you need help quitting, ask your health care provider.  Do not drink alcohol.  Take medicines only as directed by your health care provider.  Keep all follow-up visits as directed by your health care provider. This is important. This includes any further testing if your chest pain does not go away.  If heartburn is the cause for your chest pain, you may be told to keep your head raised (elevated) while sleeping. This reduces the chance that acid will go from your stomach into your esophagus.  Make lifestyle changes as directed by your health care provider. These may include:  Getting regular exercise. Ask your health care provider to suggest some activities that are safe for you.  Eating a heart-healthy diet. A registered dietitian can help you to learn healthy eating options.  Maintaining a healthy weight.  Managing diabetes, if necessary.  Reducing stress. SEEK MEDICAL CARE IF:  Your chest pain does not go away after treatment.  You have a rash with blisters on your chest.  You have a fever. SEEK IMMEDIATE MEDICAL CARE IF:   Your chest pain is worse.  You have an increasing cough, or you cough up blood.  You have severe abdominal pain.  You have severe weakness.  You faint.  You have chills.  You have sudden, unexplained chest discomfort.  You have sudden, unexplained discomfort in your arms, back, neck, or jaw.  You have shortness of breath at any time.  You suddenly start to sweat, or your skin gets clammy.  You feel nauseous or you vomit.  You suddenly feel light-headed or dizzy.  Your heart begins to beat quickly, or it feels like it is skipping beats. These symptoms may represent a serious problem that is an emergency. Do not wait to see if the symptoms  will go away. Get medical help right away. Call your local emergency services (911 in the [place] Do not drive yourself to the hospital.   This information is not intended to replace advice given to you by your health care provider. Make sure you discuss any questions you have with your health care provider.   Document Released: 01/12/2005 Document Revised: 04/25/2014 Document Reviewed: 11/08/2013 Elsevier Interactive Patient Education [employer].

## 2015-09-30 NOTE — ED Notes (Signed)
Patient c/o left chest pain that he says comes and goes every 2-3 minutes. Patient states he his scheduled for an echo cardiogram tomorrow. Patient denis any associated symptoms.

## 2015-09-30 NOTE — ED Notes (Signed)
Rn advised she would collect blood samples when she started the IV.

## 2015-09-30 NOTE — ED Provider Notes (Signed)
CSN: [id number]     Arrival date & time 09/30/15  1154 History   First MD Initiated Contact with Patient 09/30/15 1324     Chief Complaint  Patient presents with  . Chest Pain     (Consider location/radiation/quality/duration/timing/severity/associated sxs/prior Treatment) Patient is a 75 y.o. male presenting with chest pain. The history is provided by the patient and the spouse.  Chest Pain   Samuel [name] is a 75 y.o. male who presents for evaluation of chest discomfort, which he noticed this morning. Pain started about 9:00 after his usual morning chores. He was not doing any heavy lifting at that time. The pain is on and off, lasting several minutes, and time, and is somewhat sharp in nature. There is no associated diaphoresis, nausea, vomiting, weakness or dizziness. Yesterday is somewhat more active than usual, doing some hay baling. However, this work is not unusual for him. He had a similar episode of discomfort, 2 years ago and at that time saw a cardiologist, had a cardiac echo and stress tests, done, which were reassuring. He Saw his PCP, 2 weeks ago for an episode of chest discomfort with shortness of breath, and at that time, a cardiac echo was scheduled. It is to be done tomorrow. He is taking his usual medications. He did not try anything for the pain, this morning. There are no other known modifying factors.  Past Medical History  Diagnosis Date  . Hypertension   . Diverticulosis   . GERD (gastroesophageal reflux disease)   . Hemorrhoids   . Arthritis   . Tubular adenoma of colon 01/2009  . Hiatal hernia   . Heart murmur    Past Surgical History  Procedure Laterality Date  . Hernia repair    . Colonoscopy    . Polypectomy    . Replacement total knee  02/14/11    left knee   Family History  Problem Relation Age of Onset  . Breast cancer Sister   . Pancreatic cancer Brother   . Colon cancer Neg Hx   . Esophageal cancer Neg Hx   . Rectal cancer Neg Hx   . Stomach  cancer Neg Hx    Social History  Substance Use Topics  . Smoking status: Former Smoker    Quit date: 02/21/1964  . Smokeless tobacco: Never Used  . Alcohol Use: No    Review of Systems  Cardiovascular: Positive for chest pain.  All other systems reviewed and are negative.     Allergies  Codeine  Home Medications   Prior to Admission medications   Medication Sig Start Date End Date Taking? Authorizing Provider  amLODipine (NORVASC) 5 MG tablet Take 5 mg by mouth daily.   Yes Historical Provider, MD  aspirin 81 MG tablet Take 81 mg by mouth daily.   Yes Historical Provider, MD  B Complex-C (B-COMPLEX WITH VITAMIN C) tablet Take 1 tablet by mouth daily.   Yes Historical Provider, MD  Cholecalciferol (VITAMIN D3) 2000 UNITS TABS Take 1 tablet by mouth daily.    Yes Historical Provider, MD  Coenzyme Q10 (COQ-10) 100 MG CAPS Take 1 capsule by mouth daily.   Yes Historical Provider, MD  Ginger 500 MG CAPS Take 1 capsule by mouth daily.   Yes Historical Provider, MD  glycopyrrolate (ROBINUL) 2 MG tablet TAKE ONE TABLET BY MOUTH TWICE DAILY 05/27/15  Yes Ladene Artist, MD  hydrochlorothiazide (HYDRODIURIL) 25 MG tablet Take 25 mg by mouth daily.    Yes Historical  Provider, MD  losartan (COZAAR) 100 MG tablet Take 100 mg by mouth daily.   Yes Historical Provider, MD  pantoprazole (PROTONIX) 40 MG tablet Take 40 mg by mouth daily.   Yes Historical Provider, MD  Turmeric 500 MG TABS Take 1 tablet by mouth daily.   Yes Historical Provider, MD  vitamin E (VITAMIN E) 400 UNIT capsule Take 400 Units by mouth daily.   Yes Historical Provider, MD  Zinc 50 MG TABS Take 1 tablet by mouth daily.   Yes Historical Provider, MD  amoxicillin-clavulanate (AUGMENTIN) 875-125 MG tablet Take 1 tablet by mouth 2 (two) times daily. One po bid x 7 days Patient not taking: Reported on 09/30/2015 07/06/15   Julianne Rice, MD  esomeprazole (NEXIUM) 40 MG capsule Take one tablet by mouth 30 mins before  breakfast Patient not taking: Reported on 09/30/2015 02/18/15   Ladene Artist, MD  HYDROcodone-acetaminophen (NORCO) 5-325 MG tablet Take 1 tablet by mouth every 4 (four) hours as needed for moderate pain or severe pain. Patient not taking: Reported on 09/30/2015 07/06/15   Julianne Rice, MD   BP 140/81 mmHg  Pulse 62  Temp(Src) 97.4 F (36.3 C) (Oral)  Resp 17  Ht 5\' 11"  (1.803 m)  Wt 187 lb (84.823 kg)  BMI 26.09 kg/m2  SpO2 97% Physical Exam  Constitutional: He is oriented to person, place, and time. He appears well-developed and well-nourished.  Elderly, robust  HENT:  Head: Normocephalic and atraumatic.  Right Ear: External ear normal.  Left Ear: External ear normal.  Eyes: Conjunctivae and EOM are normal. Pupils are equal, round, and reactive to light.  Neck: Normal range of motion and phonation normal. Neck supple.  Cardiovascular: Normal rate, regular rhythm and normal heart sounds.   Pulmonary/Chest: Effort normal and breath sounds normal. He exhibits no bony tenderness.  Abdominal: Soft. There is no tenderness.  Musculoskeletal: Normal range of motion. He exhibits no edema or tenderness.  Neurological: He is alert and oriented to person, place, and time. No cranial nerve deficit or sensory deficit. He exhibits normal muscle tone. Coordination normal.  Skin: Skin is warm, dry and intact.  Psychiatric: He has a normal mood and affect. His behavior is normal. Judgment and thought content normal.  Nursing note and vitals reviewed.   ED Course  Procedures (including critical care time)  Medications - No data to display  Patient Vitals for the past 24 hrs:  BP Temp Temp src Pulse Resp SpO2 Height Weight  09/30/15 1358 140/81 mmHg 97.4 F (36.3 C) Oral 62 17 97 % - -  09/30/15 1218 151/78 mmHg 98.1 F (36.7 C) Oral 64 18 96 % 5\' 11"  (1.803 m) 187 lb (84.823 kg)    14:10- page requested to PCP to discuss case and follow-up care. Call returned at 15:25. The cardiologist  office will contact him tomorrow to schedule an outpatient stress test and appropriate follow-up, within one week.     3:28 PM Reevaluation with update and discussion. After initial assessment and treatment, an updated evaluation reveals no further symptoms at this time. Findings discussed with patient, wife, all questions answered. [facility] Review Labs Reviewed  BASIC METABOLIC PANEL - Abnormal; Notable for the following:    BUN 24 (*)    All other components within normal limits  CBC  I-STAT TROPOININ, ED    Imaging Review Dg Chest 2 View  09/30/2015  CLINICAL DATA:  Chest pain that started today  EXAM: CHEST  2 VIEW COMPARISON:  12/02/2013 FINDINGS: Hyperinflation and interstitial coarsening. There is no edema, consolidation, effusion, or pneumothorax. Normal heart size and mediastinal contours. IMPRESSION: 1. No evidence of active disease. 2. Chronic hyperinflation. Electronically Signed   By: Monte Fantasia M.D.   On: 09/30/2015 12:54   I have personally reviewed and evaluated these images and lab results as part of my medical decision-making.   EKG Interpretation   Date/Time:  Wednesday September 30 2015 12:16:43 EDT Ventricular Rate:  61 PR Interval:  251 QRS Duration: 91 QT Interval:  377 QTC Calculation: 380 R Axis:   47 Text Interpretation:  Sinus rhythm Prolonged PR interval since last  tracing no significant change Confirmed by Eulis Foster  MD, Landan Fedie [id number]) on  09/30/2015 1:29:23 PM      MDM   Final diagnoses:  Chest pain, unspecified chest pain type    Non-specific intermittent chest pain, recurrent problem. Lowest for cardiac disease, doubt acute coronary syndrome. Follow-up scheduled, for further testing as an outpatient in the short-term.   Nursing Notes Reviewed/ Care Coordinated Applicable Imaging Reviewed Interpretation of Laboratory Data incorporated into ED treatment  The patient appears reasonably screened and/or stabilized for  discharge and I doubt any other medical condition or other [facility] requiring further screening, evaluation, or treatment in the ED at this time prior to discharge.  Plan: Home Medications- APAP for pain; Home Treatments- rest; return here if the recommended treatment, does not improve the symptoms; Recommended follow up- PCP prn. Cardiology for stress testing and further evaluation within 1 week. Their office will call for an appointment.    Daleen Bo, MD 09/30/15 1537

## 2015-10-01 ENCOUNTER — Ambulatory Visit (HOSPITAL_COMMUNITY): Payer: Medicare Other | Attending: Cardiovascular Disease

## 2015-10-01 ENCOUNTER — Other Ambulatory Visit: Payer: Self-pay

## 2015-10-01 DIAGNOSIS — I340 Nonrheumatic mitral (valve) insufficiency: Secondary | ICD-10-CM | POA: Diagnosis not present

## 2015-10-01 DIAGNOSIS — I119 Hypertensive heart disease without heart failure: Secondary | ICD-10-CM | POA: Diagnosis not present

## 2015-10-01 DIAGNOSIS — Z87891 Personal history of nicotine dependence: Secondary | ICD-10-CM | POA: Insufficient documentation

## 2015-10-22 ENCOUNTER — Telehealth: Payer: Self-pay | Admitting: Cardiology

## 2015-10-22 NOTE — Telephone Encounter (Signed)
Spoke with pt, Follow up scheduled  

## 2015-10-22 NOTE — Telephone Encounter (Signed)
[name]  

## 2015-10-22 NOTE — Telephone Encounter (Signed)
Pt c/o of Chest Pain: STAT if CP now or developed within 24 hours  1. Are you having CP right now? NO  2. Are you experiencing any other symptoms (ex. SOB, nausea, vomiting, sweating)? Tightness in chest   3. How long have you been experiencing CP?Couple of Weeks   4. Is your CP continuous or coming and going? Coming and Going   5. Have you taken Nitroglycerin? No ?

## 2015-10-22 NOTE — Telephone Encounter (Signed)
Pt of Dr. Stanford Breed, last seen in office in 2015  Returned call to patient. He notes "still having chest pain" x 3-4 weeks. Had gone to the the ER on 6/14, had echo done next day. He saw PCP Dr. Olen Pel who ordered echo and discussed results w/ him.  Pt understood that he may need to see Dr. Stanford Breed in the office, and that possibly stress test would be ordered pending outcome of the echo. Hadn't received a call yet from [place] so was following up on this.  Notes since seen in ED he has continued having a "squeezing", at times numb sensation midsternally, which accompanies exertion. This has not been improved or worse in the past month. He denies SOB, diaphoresis, radiating pain or numbness, or lightheadedness.  Pt aware I will route to Dr. Stanford Breed to advise.

## 2015-10-27 ENCOUNTER — Encounter: Payer: Self-pay | Admitting: Physician Assistant

## 2015-10-27 ENCOUNTER — Ambulatory Visit (INDEPENDENT_AMBULATORY_CARE_PROVIDER_SITE_OTHER): Payer: Medicare Other | Admitting: Physician Assistant

## 2015-10-27 VITALS — BP 124/64 | HR 74 | Ht 71.0 in | Wt 193.1 lb

## 2015-10-27 DIAGNOSIS — I10 Essential (primary) hypertension: Secondary | ICD-10-CM | POA: Diagnosis not present

## 2015-10-27 DIAGNOSIS — R079 Chest pain, unspecified: Secondary | ICD-10-CM

## 2015-10-27 NOTE — Patient Instructions (Addendum)
Medication Instructions:  Your physician recommends that you continue on your current medications as directed. Please refer to the Current Medication list given to you today.   Labwork: None ordered  Testing/Procedures: Your physician has requested that you have en exercise stress myoview. For further information please visit [email or web address]. Please follow instruction sheet, as given.   Follow-Up: Your physician recommends that you schedule a follow-up appointment in:  2-3 [place], PA-C   Any Other Special Instructions Will Be Listed Below (If Applicable).     If you need a refill on your cardiac medications before your next appointment, please call your pharmacy.

## 2015-10-27 NOTE — Progress Notes (Signed)
Cardiology Office Note    Date:  10/27/2015   ID:  Samuel [name], DOB [birth year], MRN [id number]  PCP:  Orpah Melter, MD  Cardiologist:  Dr. Stanford Breed   Chief Complaint  Patient presents with  . Follow-up    seen for Dr. Stanford Breed    History of Present Illness:  Samuel [name] is a 75 y.o. male with PMH of HTN. He was seen last year by Dr. Stanford Breed on 07/30/2015 for chest pain. Prior to that the patient was in the emergency room in 2015 with chest pain. Echocardiogram at that time showed normal EF, probable liver cyst and ultrasound was recommended. He had a follow-up ultrasound which also showed cyst. He had a outpatient ETT on 08/28/2013 that showed fair exercise tolerance, he was able to walk total 6 minutes, clinically negative for chest pain. EKG was negative for ischemia. Occasional PVCs and PACs noted. He was most recently seen in the ED on 09/30/2015 with complaint of chest pain, goes every 2-3 minutes, trop was normal. EKG did not show ischemic changes.  In the last 2 months, patient has been having exertional dyspnea and chest discomfort. He described it as a dull ache under the left breast without radiation. 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 He denies any lower extremity edema, orthopnea or paroxysmal nocturnal dyspnea. He had had a echocardiogram on 10/01/2015 which showed EF 55-60%, normal LV size, severely dilated left atrium. He does have 1/6 systolic murmur in the mitral area. His symptom is concerning for angina, I will obtain a treadmill Myoview for now. I will see him back in 2-3 weeks.   Past Medical History  Diagnosis Date  . Hypertension   . Diverticulosis   . GERD (gastroesophageal reflux disease)   . Hemorrhoids   . Arthritis   . Tubular adenoma of colon 01/2009  . Hiatal hernia   . Heart murmur     Past Surgical History  Procedure Laterality Date  . Hernia repair    . Colonoscopy    . Polypectomy    . Replacement total knee   02/14/11    left knee    Current Medications: Outpatient Prescriptions Prior to Visit  Medication Sig Dispense Refill  . amLODipine (NORVASC) 5 MG tablet Take 5 mg by mouth daily.    Marland Kitchen aspirin 81 MG tablet Take 81 mg by mouth daily.    . B Complex-C (B-COMPLEX WITH VITAMIN C) tablet Take 1 tablet by mouth daily.    . Cholecalciferol (VITAMIN D3) 2000 UNITS TABS Take 1 tablet by mouth daily.     . Coenzyme Q10 (COQ-10) 100 MG CAPS Take 1 capsule by mouth daily.    . Ginger 500 MG CAPS Take 1 capsule by mouth daily.    Marland Kitchen glycopyrrolate (ROBINUL) 2 MG tablet TAKE ONE TABLET BY MOUTH TWICE DAILY 60 tablet 5  . HYDROcodone-acetaminophen (NORCO) 5-325 MG tablet Take 1 tablet by mouth every 4 (four) hours as needed for moderate pain or severe pain. 20 tablet 0  . losartan (COZAAR) 100 MG tablet Take 100 mg by mouth daily.    . pantoprazole (PROTONIX) 40 MG tablet Take 40 mg by mouth daily.    . Turmeric 500 MG TABS Take 1 tablet by mouth daily.    . vitamin E (VITAMIN E) 400 UNIT capsule Take 400 Units by mouth daily.    . Zinc 50 MG TABS Take 1 tablet by mouth daily.    Marland Kitchen  amoxicillin-clavulanate (AUGMENTIN) 875-125 MG tablet Take 1 tablet by mouth 2 (two) times daily. One po bid x 7 days (Patient not taking: Reported on 09/30/2015) 14 tablet 0  . esomeprazole (NEXIUM) 40 MG capsule Take one tablet by mouth 30 mins before breakfast (Patient not taking: Reported on 09/30/2015) 30 capsule 11  . hydrochlorothiazide (HYDRODIURIL) 25 MG tablet Take 25 mg by mouth daily.      No facility-administered medications prior to visit.     Allergies:   Codeine   Social History   Social History  . Marital Status: Married    Spouse Name: N/A  . Number of Children: 4  . Years of Education: N/A   Social History Main Topics  . Smoking status: Former Smoker    Quit date: 02/21/1964  . Smokeless tobacco: Never Used  . Alcohol Use: No  . Drug Use: No  . Sexual Activity: Not Asked   Other Topics Concern   . None   Social History Narrative     Family History:  The patient's family history includes Breast cancer in his sister; Pancreatic cancer in his brother. There is no history of Colon cancer, Esophageal cancer, Rectal cancer, or Stomach cancer.   ROS:   Please see the history of present illness.    ROS All other systems reviewed and are negative.   PHYSICAL EXAM:   VS:  BP 124/64 mmHg  Pulse 74  Ht 5\' 11"  (1.803 m)  Wt 193 lb 1.8 oz (87.594 kg)  BMI 26.95 kg/m2   GEN: Well nourished, well developed, in no acute distress HEENT: normal Neck: no JVD, carotid bruits, or masses Cardiac: RRR; no murmurs, rubs, or gallops,no edema  Respiratory:  clear to auscultation bilaterally, normal work of breathing GI: soft, nontender, nondistended, + BS MS: no deformity or atrophy Skin: warm and dry, no rash Neuro:  Alert and Oriented x 3, Strength and sensation are intact Psych: euthymic mood, full affect  Wt Readings from Last 3 Encounters:  10/27/15 193 lb 1.8 oz (87.594 kg)  09/30/15 187 lb (84.823 kg)  07/06/15 187 lb (84.823 kg)      Studies/Labs Reviewed:   EKG:  EKG is ordered today.  The ekg ordered today demonstrates NSR with 1st degree AV block, nonspecific St-T wave changes  Recent Labs: 09/30/2015: BUN 24*; Creatinine, Ser 0.86; Hemoglobin 13.7; Platelets 210; Potassium 3.8; Sodium 140   Lipid Panel No results found for: CHOL, TRIG, HDL, CHOLHDL, VLDL, LDLCALC, LDLDIRECT  Additional studies/ records that were reviewed today include:   Echo 10/01/2015 LV EF: 55% - 60%  ------------------------------------------------------------------- Indications: Mitral Insufficiency (I34.0).  ------------------------------------------------------------------- History: PMH: Chest pain. Murmur. Risk factors: Former tobacco use. Hypertension.  ------------------------------------------------------------------- Study Conclusions  - Left ventricle: The cavity  size was normal. Wall thickness was  normal. Systolic function was normal. The estimated ejection  fraction was in the range of 55% to 60%. Wall motion was normal;  there were no regional wall motion abnormalities. - Aortic valve: Valve area (VTI): 2.22 cm^2. Valve area (Vmax):  2.33 cm^2. Valve area (Vmean): 2.24 cm^2. - Left atrium: The atrium was severely dilated. - Right atrium: The atrium was mildly dilated.   ETT 08/28/2013 Comments: Patient presents today for routine GXT. Has had atypical chest pain. Had an ER visit back in February of 2015. Has HTN. Has done fine since being on PPI therapy. No further chest pain. [place]ains [place] his [place].  Today the patient exercised on the standard Bruce protocol for  a total of 6 minutes.  Fair exercise tolerance.  Adequate blood pressure response.  Clinically negative for chest pain. Test was stopped due to leg fatigue/achievement of target HR.  EKG negative for ischemia. No significant arrhythmia noted. Occasional PVCs and PACs noted. Rare couplet noted.     ASSESSMENT:    1. Chest pain, unspecified chest pain type   2. Essential hypertension      PLAN:  In order of problems listed above:  1. Chest pain: exertional component concerning for angina, given his age and risk factor, we will obtain treadmill nuclear stress test. If positive, then he will need cardiac catheterization. Recent echo reviewed, normal EF, EKG shows no ischemic changes.   2.  HTN: controlled on HCTZ and losartan    Medication Adjustments/Labs and Tests Ordered: Current medicines are reviewed at length with the patient today.  Concerns regarding medicines are outlined above.  Medication changes, Labs and Tests ordered today are listed in the Patient Instructions below. Patient Instructions  Medication Instructions:  Your physician recommends that you continue on your current medications as directed. Please refer to the Current Medication list given to  you today.   Labwork: None ordered  Testing/Procedures: Your physician has requested that you have en exercise stress myoview. For further information please visit [email or web address]. Please follow instruction sheet, as given.   Follow-Up: Your physician recommends that you schedule a follow-up appointment in:  2-3 [place], PA-C   Any Other Special Instructions Will Be Listed Below (If Applicable).     If you need a refill on your cardiac medications before your next appointment, please call your pharmacy.       Hilbert Corrigan, [place]  10/27/2015 9:59 PM    [facility] [place], [place], [place]  [address] Phone: [phone]; Fax: [phone]

## 2015-10-28 ENCOUNTER — Ambulatory Visit (HOSPITAL_COMMUNITY): Payer: Medicare Other | Attending: Cardiology

## 2015-10-28 DIAGNOSIS — I10 Essential (primary) hypertension: Secondary | ICD-10-CM | POA: Diagnosis not present

## 2015-10-28 DIAGNOSIS — R079 Chest pain, unspecified: Secondary | ICD-10-CM | POA: Diagnosis not present

## 2015-10-28 DIAGNOSIS — R0609 Other forms of dyspnea: Secondary | ICD-10-CM | POA: Insufficient documentation

## 2015-10-28 LAB — MYOCARDIAL PERFUSION IMAGING
CHL CUP RESTING HR STRESS: 64 {beats}/min
CSEPED: 5 min
CSEPEDS: 0 s
CSEPEW: 7 METS
CSEPPHR: 142 {beats}/min
LV dias vol: 113 mL (ref 62–150)
LVSYSVOL: 49 mL
MPHR: 143 {beats}/min
NUC STRESS TID: 1
Percent HR: 99 %
RATE: 0.3
SDS: 5
SRS: 3
SSS: 8

## 2015-10-28 MED ORDER — TECHNETIUM TC 99M TETROFOSMIN IV KIT
10.5000 | PACK | Freq: Once | INTRAVENOUS | Status: AC | PRN
  Administered 2015-10-28: 11 via INTRAVENOUS
  Filled 2015-10-28: qty 11

## 2015-10-28 MED ORDER — TECHNETIUM TC 99M TETROFOSMIN IV KIT
32.6000 | PACK | Freq: Once | INTRAVENOUS | Status: AC | PRN
  Administered 2015-10-28: 33 via INTRAVENOUS
  Filled 2015-10-28: qty 33

## 2015-11-02 ENCOUNTER — Telehealth: Payer: Self-pay | Admitting: Cardiology

## 2015-11-02 NOTE — Telephone Encounter (Signed)
Returned call to patient.10/30/15 Myoview results given.Stated he continues to have occasional chest pain.Advised to keep appointment with Almyra Deforest PA 11/13/15 at 9:00 am at [facility] office.Advised to call sooner if needed.

## 2015-11-02 NOTE — Telephone Encounter (Signed)
New message ° ° ° ° ° °Calling to get stress test results °

## 2015-11-13 ENCOUNTER — Ambulatory Visit (INDEPENDENT_AMBULATORY_CARE_PROVIDER_SITE_OTHER): Payer: Medicare Other | Admitting: Physician Assistant

## 2015-11-13 ENCOUNTER — Encounter: Payer: Self-pay | Admitting: Physician Assistant

## 2015-11-13 VITALS — BP 124/50 | HR 72 | Ht 70.0 in | Wt 196.0 lb

## 2015-11-13 DIAGNOSIS — R079 Chest pain, unspecified: Secondary | ICD-10-CM

## 2015-11-13 DIAGNOSIS — I10 Essential (primary) hypertension: Secondary | ICD-10-CM

## 2015-11-13 DIAGNOSIS — R0602 Shortness of breath: Secondary | ICD-10-CM

## 2015-11-13 NOTE — Progress Notes (Signed)
Cardiology Office Note    Date:  11/13/2015   ID:  Samuel [name], Samuel [name] [birth year], MRN [id number]  PCP:  Samuel Melter, MD  Cardiologist:  Dr. Stanford [name]  Chief Complaint  Patient presents with  . Follow-up    seen for Dr. Stanford [name]    History of Present Illness:  Samuel [name] is a 75 y.o. male with PMH of HTN.  He was last seen by Dr. Stanford [name] on 07/29/2013 for chest pain. Prior to that the patient was in the emergency room in 2015 with chest pain. Echocardiogram at that time showed normal EF, probable liver cyst and ultrasound was recommended. He had a follow-up ultrasound which also showed cyst. He had a outpatient ETT on 08/28/2013 that showed fair exercise tolerance, he was able to walk total 6 minutes, clinically negative for chest pain. EKG was negative for ischemia. Occasional PVCs and PACs noted. He was most recently seen in the ED on 09/30/2015 with complaint of chest pain, goes every 2-3 minutes, trop was normal. EKG did not show ischemic changes.  I saw the patient in the office on 10/27/2015 for intermittent exertional dyspnea and chest discomfort. He described it as a dull ache without radiation. Occurring for the previous 2 month. 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 He had echocardiogram in June 2017 that showed EF 55-60%, normal LV size, severely dilated left atrium. Outpatient treadmill Myoview was arranged on 10/28/2015, he had no EKG changes, EF 57%, overall low risk study without sign of previous infarct or ischemia. He did have hypertensive BP response with peak blood pressure 214/88.   He presents today for cardiology office follow-up. He says his symptom has gradually been improving. He still has a substernal chest discomfort which is worse with body rotation. He also have a sharp left rib pain which he has attributed to his previous surgery for hiatal hernia. I encouraged him to continue to monitor his symptom for now. He is trying to get back with  exercise bicycle to help with his shortness of breath during exertion. As for his hypertensive response during the stress test, I did discuss with the patient, he says he did take his medication in morning of stress test. However his current blood pressure is 124/80. I will not increase any blood pressure medications this time. He does mention that he has been taking hydrochlorothiazide 25 mg daily for the past 15 years, this was not documented in our system, we have updated our system.    Past Medical History:  Diagnosis Date  . Arthritis   . Diverticulosis   . GERD (gastroesophageal reflux disease)   . Heart murmur   . Hemorrhoids   . Hiatal hernia   . Hypertension   . Tubular adenoma of colon 01/2009    Past Surgical History:  Procedure Laterality Date  . COLONOSCOPY    . HERNIA REPAIR    . POLYPECTOMY    . REPLACEMENT TOTAL KNEE  02/14/11   left knee    Current Medications: Outpatient Medications Prior to Visit  Medication Sig Dispense Refill  . amLODipine (NORVASC) 5 MG tablet Take 5 mg by mouth daily.    Marland Kitchen aspirin 81 MG tablet Take 81 mg by mouth daily.    . B Complex-C (B-COMPLEX WITH VITAMIN C) tablet Take 1 tablet by mouth daily.    . Cholecalciferol (VITAMIN D3) 2000 UNITS TABS Take 1 tablet by mouth daily.     . Coenzyme Q10 (  COQ-10) 100 MG CAPS Take 1 capsule by mouth daily.    . Ginger 500 MG CAPS Take 1 capsule by mouth daily.    Marland Kitchen glycopyrrolate (ROBINUL) 2 MG tablet TAKE ONE TABLET BY MOUTH TWICE DAILY 60 tablet 5  . losartan (COZAAR) 100 MG tablet Take 100 mg by mouth daily.    . pantoprazole (PROTONIX) 40 MG tablet Take 40 mg by mouth daily.    . Turmeric 500 MG TABS Take 1 tablet by mouth daily.    . vitamin E (VITAMIN E) 400 UNIT capsule Take 400 Units by mouth daily.    . Zinc 50 MG TABS Take 1 tablet by mouth daily.    Marland Kitchen HYDROcodone-acetaminophen (NORCO) 5-325 MG tablet Take 1 tablet by mouth every 4 (four) hours as needed for moderate pain or severe  pain. 20 tablet 0   No facility-administered medications prior to visit.      Allergies:   Codeine   Social History   Social History  . Marital status: Married    Spouse name: N/A  . Number of children: 4  . Years of education: N/A   Social History Main Topics  . Smoking status: Former Smoker    Quit date: 02/21/1964  . Smokeless tobacco: Never Used  . Alcohol use No  . Drug use: No  . Sexual activity: Not Asked   Other Topics Concern  . None   Social History Narrative  . None     Family History:  The patient's family history includes Breast cancer in his sister; Pancreatic cancer in his brother.   ROS:   Please see the history of present illness.    ROS All other systems reviewed and are negative.   PHYSICAL EXAM:   VS:  BP (!) 124/50   Pulse 72   Ht 5\' 10"  (1.778 m)   Wt 196 lb (88.9 kg)   SpO2 95%   BMI 28.12 kg/m    GEN: Well nourished, well developed, in no acute distress  HEENT: normal  Neck: no JVD, carotid bruits, or masses Cardiac: RRR; no rubs, or gallops,no edema  [id number] systolic murmur Respiratory:  clear to auscultation bilaterally, normal work of breathing GI: soft, nontender, nondistended, + BS MS: no deformity or atrophy  Skin: warm and dry, no rash Neuro:  Alert and Oriented x 3, Strength and sensation are intact Psych: euthymic mood, full affect  Wt Readings from Last 3 Encounters:  11/13/15 196 lb (88.9 kg)  10/27/15 193 lb 1.8 oz (87.6 kg)  09/30/15 187 lb (84.8 kg)      Studies/Labs Reviewed:   EKG:  EKG is not ordered today.  Recent Labs: 09/30/2015: BUN 24; Creatinine, Ser 0.86; Hemoglobin 13.7; Platelets 210; Potassium 3.8; Sodium 140    Additional studies/ records that were reviewed today include:   Treadmill myoview 10/28/2015 Study Highlights    Nuclear stress EF: 57%.  Blood pressure demonstrated a hypertensive response to exercise.  There was no ST segment deviation noted during stress.  The study is  normal.  This is a low risk study.  The left ventricular ejection fraction is normal (55-65%).   Normal exercise nuclear stress test with no evidence of prior infarct or ischemia. Good  exercise capacity. Hypertensive BP response to stress with peak BP 214/88 mmHg.      Echo 10/01/2015 LV EF: 55% -   60%  ------------------------------------------------------------------- Indications:      Mitral Insufficiency (I34.0).  ------------------------------------------------------------------- History:   PMH:  Chest pain.  Murmur.  Risk factors:  Former tobacco use. Hypertension.  ------------------------------------------------------------------- Study Conclusions  - Left ventricle: The cavity size was normal. Wall thickness was   normal. Systolic function was normal. The estimated ejection   fraction was in the range of 55% to 60%. Wall motion was normal;   there were no regional wall motion abnormalities. - Aortic valve: Valve area (VTI): 2.22 cm^2. Valve area (Vmax):   2.33 cm^2. Valve area (Vmean): 2.24 cm^2. - Left atrium: The atrium was severely dilated. - Right atrium: The atrium was mildly dilated.   ASSESSMENT:    1. Chest pain, unspecified chest pain type   2. Shortness of breath on exertion   3. Essential hypertension      PLAN:  In order of problems listed above:  1. Chest pain: normal echo with negative myoview, will continue to monitor symptom. Some of his symptoms are atypical. Patient is very active [place] the farm, he also start on exercise bicycle to help with his SOB  2. Hypertension: well controlled on amlodipine, losartan and HCTZ    Medication Adjustments/Labs and Tests Ordered: Current medicines are reviewed at length with the patient today.  Concerns regarding medicines are outlined above.  Medication changes, Labs and Tests ordered today are listed in the Patient Instructions below. Patient Instructions  Medication Instructions:   None  Labwork: None  Testing/Procedures: None  Follow-Up: Your physician wants you to follow-up in: 6 months with Dr. Stanford [name]. You will receive a reminder letter in the mail two months in advance. If you don't receive a letter, please call our office to schedule the follow-up appointment.   Any Other Special Instructions Will Be Listed Below (If Applicable).     If you need a refill on your cardiac medications before your next appointment, please call your pharmacy.      Hilbert Corrigan, [place]  11/13/2015 9:26 AM    [address], [place], [place]  [address] Phone: [phone]; Fax: [phone]

## 2015-11-13 NOTE — Patient Instructions (Signed)
Medication Instructions:  None  Labwork: None  Testing/Procedures: None  Follow-Up: Your physician wants you to follow-up in: 6 months with Dr. Stanford Breed. You will receive a reminder letter in the mail two months in advance. If you don't receive a letter, please call our office to schedule the follow-up appointment.   Any Other Special Instructions Will Be Listed Below (If Applicable).     If you need a refill on your cardiac medications before your next appointment, please call your pharmacy.

## 2016-05-17 ENCOUNTER — Encounter: Payer: Self-pay | Admitting: Gastroenterology

## 2016-07-15 ENCOUNTER — Other Ambulatory Visit: Payer: Self-pay | Admitting: Gastroenterology

## 2016-09-13 ENCOUNTER — Encounter: Payer: Self-pay | Admitting: Physician Assistant

## 2016-09-13 ENCOUNTER — Ambulatory Visit (INDEPENDENT_AMBULATORY_CARE_PROVIDER_SITE_OTHER): Payer: Medicare Other | Admitting: Physician Assistant

## 2016-09-13 VITALS — BP 130/70 | HR 68 | Ht 70.0 in | Wt 194.0 lb

## 2016-09-13 DIAGNOSIS — Z8719 Personal history of other diseases of the digestive system: Secondary | ICD-10-CM | POA: Diagnosis not present

## 2016-09-13 DIAGNOSIS — Z1211 Encounter for screening for malignant neoplasm of colon: Secondary | ICD-10-CM | POA: Diagnosis not present

## 2016-09-13 DIAGNOSIS — Z86010 Personal history of colonic polyps: Secondary | ICD-10-CM

## 2016-09-13 DIAGNOSIS — K5732 Diverticulitis of large intestine without perforation or abscess without bleeding: Secondary | ICD-10-CM

## 2016-09-13 MED ORDER — NA SULFATE-K SULFATE-MG SULF 17.5-3.13-1.6 GM/177ML PO SOLN: 1.0000 | Freq: Once | ORAL | 0 refills | Status: AC

## 2016-09-13 MED ORDER — GLYCOPYRROLATE 2 MG PO TABS: 2.0000 mg | ORAL_TABLET | Freq: Two times a day (BID) | ORAL | 3 refills | Status: DC

## 2016-09-13 NOTE — Patient Instructions (Addendum)
We have sent the following medications to your pharmacy for you to pick up at your convenience: [place], [place]. 1. Robinul Forte ( Glycopyrolate)  2 mg.  2.   You have been scheduled for an endoscopy and colonoscopy. Please follow the written instructions given to you at your visit today. Please pick up your prep supplies at the pharmacy within the next 1-3 days.  If you use inhalers (even only as needed), please bring them with you on the day of your procedure. Your physician has requested that you go to www.startemmi.com and enter the access code given to you at your visit today. This web site gives a general overview about your procedure. However, you should still follow specific instructions given to you by our office regarding your preparation for the procedure.

## 2016-09-13 NOTE — Progress Notes (Signed)
Subjective:    Patient ID: Samuel [name], male    DOB: [birth year], 76 y.o.   MRN: [id number]  HPI Samuel [name] is a pleasant 76 year old white male, known to Dr. Fuller Plan who comes in today to follow up after 2 recent episodes of diverticulitis which were treated by his PCP Dr. Doyle Askew. Patient has history of GERD, Barrett's esophagus, diverticulosis, hypertension, IBS and history of adenomatous colon polyps. He was last seen in our office in 2016. Patient says he had not had an episode of diverticulitis for several years ever since she started taking Robinul in 2013. He had run out of the Robinul and then had 2 episodes of diverticulitis this spring. These were treated with Augmentin 10 days with resolution. He currently feels fine and has no complaints of abdominal pain, is having normal bowel movements, no melena or hematochezia. Last endoscopy was done in March 2015 with finding of a 4 cm hiatal hernia a variable Z line which was biopsied and gastritis. Biopsy showed chronic gastritis no H. pylori and esophageal biopsy showed a Barrett's esophagus without dysplasia. Last colonoscopy December 2013 was done for history of adenomatous colon polyps he had moderate diverticulosis in the left and sigmoid colon and internal hemorrhoids. There were no polyps. He was recommended for 5 year interval follow-up. Patient has questions about diet and whether he should be avoiding seeds etc. Also question whether he should have follow-up exams at his age.  Review of Systems Pertinent positive and negative review of systems were noted in the above HPI section.  All other review of systems was otherwise negative.  Outpatient Encounter Prescriptions as of 09/13/2016  Medication Sig  . amLODipine (NORVASC) 5 MG tablet Take 5 mg by mouth daily.  Marland Kitchen aspirin 81 MG tablet Take 81 mg by mouth daily.  . B Complex-C (B-COMPLEX WITH VITAMIN C) tablet Take 1 tablet by mouth daily.  . Cholecalciferol (VITAMIN D3) 2000 UNITS TABS Take 1  tablet by mouth daily.   . Coenzyme Q10 (COQ-10) 100 MG CAPS Take 1 capsule by mouth daily.  . Ginger 500 MG CAPS Take 1 capsule by mouth daily.  . hydrochlorothiazide (HYDRODIURIL) 25 MG tablet Take 25 mg by mouth daily.  Marland Kitchen losartan (COZAAR) 100 MG tablet Take 100 mg by mouth daily.  . pantoprazole (PROTONIX) 40 MG tablet Take 40 mg by mouth daily.  . Turmeric 500 MG TABS Take 1 tablet by mouth daily.  . vitamin E (VITAMIN E) 400 UNIT capsule Take 400 Units by mouth daily.  . Zinc 50 MG TABS Take 1 tablet by mouth daily.  Marland Kitchen glycopyrrolate (ROBINUL) 2 MG tablet Take 1 tablet (2 mg total) by mouth 2 (two) times daily.  . Na Sulfate-K Sulfate-Mg Sulf 17.5-3.13-1.6 GM/180ML SOLN Take 1 kit by mouth once.  . [DISCONTINUED] glycopyrrolate (ROBINUL) 2 MG tablet TAKE ONE TABLET BY MOUTH TWICE DAILY   No facility-administered encounter medications on file as of 09/13/2016.    Allergies  Allergen Reactions  . Codeine     REACTION: Agitation   Patient Active Problem List   Diagnosis Date Noted  . Barrett's esophagus without dysplasia 02/18/2015  . Chest pain 07/29/2013  . Diverticulosis 02/21/2012  . HTN (hypertension) 02/21/2012  . GERD (gastroesophageal reflux disease) 02/21/2012   Social History   Social History  . Marital status: Married    Spouse name: N/A  . Number of children: 4  . Years of education: N/A   Occupational History  . Not on file.  Social History Main Topics  . Smoking status: Former Smoker    Quit date: 02/21/1964  . Smokeless tobacco: Never Used  . Alcohol use No  . Drug use: No  . Sexual activity: Not on file   Other Topics Concern  . Not on file   Social History Narrative  . No narrative on file    Samuel [name]'s family history includes Breast cancer in his sister; Pancreatic cancer in his brother.      Objective:    Vitals:   09/13/16 1359  BP: 130/70  Pulse: 68    Physical Exam  well-developed older white male in no acute distress,  accompanied by his wife both very pleasant blood pressure 130/70 pulse 68, Height 5 foot 10, weight 194, BMI 27.8. HEENT; nontraumatic normocephalic EOMI PERRLA sclera anicteric, Cardiovascular regular rate and rhythm with S1-S2 no murmur or gallop, Pulmonary; clear bilaterally, Abdomen ;soft, nontender nondistended bowel sounds are active there is no palpable mass or hepatosplenomegaly, Rectal ;exam not done, Extremities; no clubbing cyanosis or edema skin warm and dry, Neuropsych; mood and affect appropriate       Assessment & Plan:   #79 76 year old white male with 2 recent episodes of diverticulitis treated with Augmentin and resolved. Prior to that he had not had an episode of diverticulitis for over 5 years. #2 Barrett's esophagus-found at last EGD March 2015 due for follow-up #3 chronic GERD controlled on pantoprazole 40 mg daily #4 history of adenomatous colon polyps-negative colonoscopy December 2013 indicated for 5 year interval follow-up #5 hypertension   Plan; Will resume Robinul Forte 2 mg by mouth 1-2 times daily on a regular basis High-fiber diet and reviewed that current literature does not support avoidance of seeds and nuts etc. Continue Protonix 40 mg by mouth every morning Patient will be scheduled for upper endoscopy for surveillance of Barrett's esophagus and also for colonoscopy with Dr. Fuller Plan. Both procedures were discussed in detail with patient and coding risk and benefits and he is agreeable to proceed. We discussed that he probably would not need further surveillance after these procedures due to advanced age.  Samuel Bouffard Genia Harold PA-C 09/13/2016   Cc: Orpah Melter, MD

## 2016-09-13 NOTE — Progress Notes (Signed)
Reviewed and agree with management plan.  Abran Gavigan T. Afra Tricarico, MD FACG 

## 2016-09-15 ENCOUNTER — Telehealth: Payer: Self-pay | Admitting: *Deleted

## 2016-09-15 NOTE — Telephone Encounter (Signed)
[place] to advise I had done a prior authorization for this patient's Medication, Robinul Forte 2 mg.  His insurance company denied the coverage.  I called [facility] to ask if the patient paid out of pocket. I was told the patient paid $88.59 for a 90 day supply.

## 2016-10-27 ENCOUNTER — Encounter: Payer: Self-pay | Admitting: Gastroenterology

## 2016-11-08 ENCOUNTER — Ambulatory Visit (AMBULATORY_SURGERY_CENTER): Payer: Medicare Other | Admitting: Gastroenterology

## 2016-11-08 ENCOUNTER — Encounter: Payer: Self-pay | Admitting: Gastroenterology

## 2016-11-08 VITALS — BP 128/63 | HR 62 | Temp 98.6°F | Resp 12 | Ht 70.0 in | Wt 194.0 lb

## 2016-11-08 DIAGNOSIS — D123 Benign neoplasm of transverse colon: Secondary | ICD-10-CM

## 2016-11-08 DIAGNOSIS — Z860100 Personal history of colon polyps, unspecified: Secondary | ICD-10-CM

## 2016-11-08 DIAGNOSIS — K2270 Barrett's esophagus without dysplasia: Secondary | ICD-10-CM

## 2016-11-08 DIAGNOSIS — D120 Benign neoplasm of cecum: Secondary | ICD-10-CM

## 2016-11-08 DIAGNOSIS — Z860101 Personal history of adenomatous and serrated colon polyps: Secondary | ICD-10-CM

## 2016-11-08 MED ORDER — SODIUM CHLORIDE 0.9 % IV SOLN: 500.0000 mL | INTRAVENOUS | Status: AC

## 2016-11-08 NOTE — Op Note (Addendum)
[facility] Patient Name: Samuel [name] Procedure Date: 11/08/2016 1:51 PM MRN: [id number] Endoscopist: Ladene Artist , MD Age: 76 Referring MD:  Date of Birth: 09-Jun-1940 Gender: Male Account #: [id number] Procedure:                Colonoscopy Indications:              Surveillance: Personal history of adenomatous                            polyps on last colonoscopy 5 years ago Medicines:                Monitored Anesthesia Care Procedure:                Pre-Anesthesia Assessment:                           - Prior to the procedure, a History and Physical                            was performed, and patient medications and                            allergies were reviewed. The patient's tolerance of                            previous anesthesia was also reviewed. The risks                            and benefits of the procedure and the sedation                            options and risks were discussed with the patient.                            All questions were answered, and informed consent                            was obtained. Prior Anticoagulants: The patient has                            taken no previous anticoagulant or antiplatelet                            agents. ASA Grade Assessment: II - A patient with                            mild systemic disease. After reviewing the risks                            and benefits, the patient was deemed in                            satisfactory condition to undergo the procedure.  After obtaining informed consent, the colonoscope                            was passed under direct vision. Throughout the                            procedure, the patient's blood pressure, pulse, and                            oxygen saturations were monitored continuously. The                            Colonoscope was introduced through the anus and                            advanced to the the cecum,  identified by                            appendiceal orifice and ileocecal valve. The                            ileocecal valve, appendiceal orifice, and rectum                            were photographed. The quality of the bowel                            preparation was adequate after extensive lavage and                            suctioning. The patient tolerated the procedure                            well. The colonoscopy was technically difficult and                            complex due to a tortuous colon. Successful                            completion of the procedure was aided by applying                            abdominal pressure and performing the maneuvers                            documented (below) in this report. Scope In: 1:58:00 PM Scope Out: 2:21:00 PM Scope Withdrawal Time: 0 hours 12 minutes 0 seconds  Total Procedure Duration: 0 hours 23 minutes 0 seconds  Findings:                 The perianal and digital rectal examinations were                            normal.  Two sessile polyps were found in the transverse                            colon and cecum. The polyps were 6 mm in size.                            These polyps were removed with a cold snare.                            Resection and retrieval were complete.                           Scattered medium-mouthed diverticula were found in                            the transverse colon and cecum. There was no                            evidence of diverticular bleeding.                           Multiple medium-mouthed diverticula were found in                            the left colon. There was narrowing of the colon in                            association with the diverticular opening. There                            was evidence of diverticular spasm. There was                            evidence of an impacted diverticulum. There was no                             evidence of diverticular bleeding. Unable to                            advance past the sigmoid colon with the peds                            colonoscope so changed to an upper endoscope which                            successfully advanced to the cecum.                           The exam was otherwise without abnormality on                            direct and retroflexion views. Complications:            No immediate complications. Estimated blood loss:  None. Estimated Blood Loss:     Estimated blood loss: none. Impression:               - Two 6 mm polyps in the transverse colon and in                            the cecum, removed with a cold snare. Resected and                            retrieved.                           - Mild diverticulosis in the transverse colon and                            in the cecum. There was no evidence of diverticular                            bleeding.                           - Severe diverticulosis in the left colon. There                            was narrowing of the colon in association with the                            diverticular opening. There was evidence of                            diverticular spasm. There was evidence of an                            impacted diverticulum. There was no evidence of                            diverticular bleeding.                           - The examination was otherwise normal on direct                            and retroflexion views. Recommendation:           - Patient has a contact number available for                            emergencies. The signs and symptoms of potential                            delayed complications were discussed with the                            patient. Return to normal activities tomorrow.  Written discharge instructions were provided to the                            patient.                           -  High fiber diet.                           - Continue present medications.                           - Await pathology results.                           - No repeat colonoscopy due to age. Ladene Artist, MD 11/08/2016 2:30:47 PM This report has been signed electronically.

## 2016-11-08 NOTE — Progress Notes (Signed)
Report given to PACU, vss 

## 2016-11-08 NOTE — Progress Notes (Signed)
Called to room to assist during endoscopic procedure.  Patient ID and intended procedure confirmed with present staff. Received instructions for my participation in the procedure from the performing physician.  

## 2016-11-08 NOTE — Patient Instructions (Signed)
Handouts given: Polyps, Diverticulosis and High  Fiber. Also GERD and Barrett's.   YOU HAD AN ENDOSCOPIC PROCEDURE TODAY AT [facility]:   Refer to the procedure report that was given to you for any specific questions about what was found during the examination.  If the procedure report does not answer your questions, please call your gastroenterologist to clarify.  If you requested that your care partner not be given the details of your procedure findings, then the procedure report has been included in a sealed envelope for you to review at your convenience later.  YOU SHOULD EXPECT: Some feelings of bloating in the abdomen. Passage of more gas than usual.  Walking can help get rid of the air that was put into your GI tract during the procedure and reduce the bloating. If you had a lower endoscopy (such as a colonoscopy or flexible sigmoidoscopy) you may notice spotting of blood in your stool or on the toilet paper. If you underwent a bowel prep for your procedure, you may not have a normal bowel movement for a few days.  Please Note:  You might notice some irritation and congestion in your nose or some drainage.  This is from the oxygen used during your procedure.  There is no need for concern and it should clear up in a day or so.  SYMPTOMS TO REPORT IMMEDIATELY:   Following lower endoscopy (colonoscopy or flexible sigmoidoscopy):  Excessive amounts of blood in the stool  Significant tenderness or worsening of abdominal pains  Swelling of the abdomen that is new, acute  Fever of 100F or higher   Following upper endoscopy (EGD)  Vomiting of blood or coffee ground material  New chest pain or pain under the shoulder blades  Painful or persistently difficult swallowing  New shortness of breath  Fever of 100F or higher  Black, tarry-looking stools  For urgent or emergent issues, a gastroenterologist can be reached at any hour by calling [phone].   DIET:  We do  recommend a small meal at first, but then you may proceed to your regular diet.  Drink plenty of fluids but you should avoid alcoholic beverages for 24 hours.  ACTIVITY:  You should plan to take it easy for the rest of today and you should NOT DRIVE or use heavy machinery until tomorrow (because of the sedation medicines used during the test).    FOLLOW UP: Our staff will call the number listed on your records the next business day following your procedure to check on you and address any questions or concerns that you may have regarding the information given to you following your procedure. If we do not reach you, we will leave a message.  However, if you are feeling well and you are not experiencing any problems, there is no need to return our call.  We will assume that you have returned to your regular daily activities without incident.  If any biopsies were taken you will be contacted by phone or by letter within the next 1-3 weeks.  Please call us at [phone] if you have not heard about the biopsies in 3 weeks.    SIGNATURES/CONFIDENTIALITY: You and/or your care partner have signed paperwork which will be entered into your electronic medical record.  These signatures attest to the fact that that the information above on your After Visit Summary has been reviewed and is understood.  Full responsibility of the confidentiality of this discharge information lies with you and/or  your care-partner. 

## 2016-11-08 NOTE — Op Note (Signed)
[facility] Patient Name: Samuel [name] Procedure Date: 11/08/2016 1:50 PM MRN: [id number] Endoscopist: Ladene Artist , MD Age: 76 Referring MD:  Date of Birth: April 02, 1941 Gender: Male Account #: [id number] Procedure:                Upper GI endoscopy Indications:              Surveillance for malignancy due to personal history                            of Barrett's esophagus Medicines:                Monitored Anesthesia Care Procedure:                Pre-Anesthesia Assessment:                           - Prior to the procedure, a History and Physical                            was performed, and patient medications and                            allergies were reviewed. The patient's tolerance of                            previous anesthesia was also reviewed. The risks                            and benefits of the procedure and the sedation                            options and risks were discussed with the patient.                            All questions were answered, and informed consent                            was obtained. Prior Anticoagulants: The patient has                            taken no previous anticoagulant or antiplatelet                            agents. ASA Grade Assessment: II - A patient with                            mild systemic disease. After reviewing the risks                            and benefits, the patient was deemed in                            satisfactory condition to undergo the procedure.  After obtaining informed consent, the endoscope was                            passed under direct vision. Throughout the                            procedure, the patient's blood pressure, pulse, and                            oxygen saturations were monitored continuously. The                            Model GIF-HQ190 [id number]) scope was introduced                            through the mouth, and advanced to  the second part                            of duodenum. The upper GI endoscopy was                            accomplished without difficulty. The patient                            tolerated the procedure well. Scope In: 2:05:44 PM Scope Out: 2:21:10 PM Scope Withdrawal Time: 0 hours 11 minutes 30 seconds  Total Procedure Duration: 0 hours 15 minutes 26 seconds  Findings:                 There were esophageal mucosal changes secondary to                            established short-segment Barrett's disease present                            in the distal esophagus and at the gastroesophageal                            junction. The maximum longitudinal extent of these                            mucosal changes was 1 cm in length. Mucosa was                            biopsied with a cold forceps for histology in 4                            quadrants at intervals of 0.5 cm in the lower third                            of the esophagus and at the gastroesophageal                            junction. One specimen  bottle was sent to pathology.                           The exam of the esophagus was otherwise normal.                           A medium-sized hiatal hernia was present.                           The exam of the stomach was otherwise normal                            however the stomach was suboptimally insufflated as                            pt continually belched insufflated air.                           The duodenal bulb and second portion of the                            duodenum were normal. Complications:            No immediate complications. Estimated Blood Loss:     Estimated blood loss was minimal. Impression:               - Esophageal mucosal changes secondary to                            established short-segment Barrett's disease.                            Biopsied.                           - Medium-sized hiatal hernia.                           - Normal  duodenal bulb and second portion of the                            duodenum. Recommendation:           - Patient has a contact number available for                            emergencies. The signs and symptoms of potential                            delayed complications were discussed with the                            patient. Return to normal activities tomorrow.                            Written discharge instructions were provided to the  patient.                           - Resume previous diet.                           - Continue present medications.                           - Await pathology results.                           - If no dysplasia noted then no plans for future                            surveillance upper endoscopies due to age. Ladene Artist, MD 11/08/2016 2:47:42 PM This report has been signed electronically.

## 2016-11-09 ENCOUNTER — Telehealth: Payer: Self-pay | Admitting: *Deleted

## 2016-11-09 NOTE — Telephone Encounter (Signed)
  Follow up Call-  Call back number 11/08/2016  Post procedure Call Back phone  # [phone] or [phone]  Permission to leave phone message Yes  Some recent data might be hidden     Patient questions:  Do you have a fever, pain , or abdominal swelling? No. Pain Score  0 *  Have you tolerated food without any problems? Yes.    Have you been able to return to your normal activities? Yes.    Do you have any questions about your discharge instructions: Diet   No. Medications  No. Follow up visit  No.  Do you have questions or concerns about your Care? No.  Actions: * If pain score is 4 or above: No action needed, pain <4.

## 2016-11-18 ENCOUNTER — Encounter: Payer: Self-pay | Admitting: Gastroenterology

## 2017-02-14 ENCOUNTER — Ambulatory Visit (HOSPITAL_BASED_OUTPATIENT_CLINIC_OR_DEPARTMENT_OTHER)
Admission: RE | Admit: 2017-02-14 | Discharge: 2017-02-14 | Disposition: A | Discharge status: Home / Self Care | Payer: Medicare Other | Source: Ambulatory Visit | Attending: Family Medicine | Admitting: Family Medicine

## 2017-02-14 ENCOUNTER — Other Ambulatory Visit (HOSPITAL_BASED_OUTPATIENT_CLINIC_OR_DEPARTMENT_OTHER): Payer: Self-pay | Admitting: Family Medicine

## 2017-02-14 ENCOUNTER — Encounter (HOSPITAL_BASED_OUTPATIENT_CLINIC_OR_DEPARTMENT_OTHER): Payer: Self-pay

## 2017-02-14 DIAGNOSIS — R109 Unspecified abdominal pain: Secondary | ICD-10-CM

## 2017-02-14 DIAGNOSIS — K5732 Diverticulitis of large intestine without perforation or abscess without bleeding: Secondary | ICD-10-CM | POA: Insufficient documentation

## 2017-02-14 DIAGNOSIS — N281 Cyst of kidney, acquired: Secondary | ICD-10-CM | POA: Diagnosis not present

## 2017-02-14 DIAGNOSIS — K449 Diaphragmatic hernia without obstruction or gangrene: Secondary | ICD-10-CM | POA: Diagnosis not present

## 2017-02-14 DIAGNOSIS — M5136 Other intervertebral disc degeneration, lumbar region: Secondary | ICD-10-CM | POA: Diagnosis not present

## 2017-02-14 MED ORDER — IOPAMIDOL (ISOVUE-300) INJECTION 61%
100.0000 mL | Freq: Once | INTRAVENOUS | Status: AC | PRN
  Administered 2017-02-14: 100 mL via INTRAVENOUS

## 2017-02-23 ENCOUNTER — Encounter: Payer: Self-pay | Admitting: Gastroenterology

## 2017-02-23 ENCOUNTER — Ambulatory Visit (INDEPENDENT_AMBULATORY_CARE_PROVIDER_SITE_OTHER): Payer: Medicare Other | Admitting: Gastroenterology

## 2017-02-23 ENCOUNTER — Other Ambulatory Visit (INDEPENDENT_AMBULATORY_CARE_PROVIDER_SITE_OTHER): Payer: Medicare Other

## 2017-02-23 VITALS — BP 100/60 | HR 76 | Ht 70.0 in | Wt 192.6 lb

## 2017-02-23 DIAGNOSIS — K5732 Diverticulitis of large intestine without perforation or abscess without bleeding: Secondary | ICD-10-CM

## 2017-02-23 DIAGNOSIS — K588 Other irritable bowel syndrome: Secondary | ICD-10-CM

## 2017-02-23 DIAGNOSIS — K219 Gastro-esophageal reflux disease without esophagitis: Secondary | ICD-10-CM | POA: Diagnosis not present

## 2017-02-23 DIAGNOSIS — K2270 Barrett's esophagus without dysplasia: Secondary | ICD-10-CM | POA: Diagnosis not present

## 2017-02-23 LAB — CBC WITH DIFFERENTIAL/PLATELET
BASOS PCT: 0.8 % (ref 0.0–3.0)
Basophils Absolute: 0 10*3/uL (ref 0.0–0.1)
EOS PCT: 1.5 % (ref 0.0–5.0)
Eosinophils Absolute: 0.1 10*3/uL (ref 0.0–0.7)
HCT: 40.6 % (ref 39.0–52.0)
Hemoglobin: 13.5 g/dL (ref 13.0–17.0)
LYMPHS ABS: 1.7 10*3/uL (ref 0.7–4.0)
Lymphocytes Relative: 34 % (ref 12.0–46.0)
MCHC: 33.2 g/dL (ref 30.0–36.0)
MCV: 89.4 fl (ref 78.0–100.0)
MONOS PCT: 15.6 % — AB (ref 3.0–12.0)
Monocytes Absolute: 0.8 10*3/uL (ref 0.1–1.0)
NEUTROS ABS: 2.4 10*3/uL (ref 1.4–7.7)
NEUTROS PCT: 48.1 % (ref 43.0–77.0)
Platelets: 198 10*3/uL (ref 150.0–400.0)
RBC: 4.55 Mil/uL (ref 4.22–5.81)
RDW: 14.2 % (ref 11.5–15.5)
WBC: 5 10*3/uL (ref 4.0–10.5)

## 2017-02-23 LAB — COMPREHENSIVE METABOLIC PANEL
ALK PHOS: 58 U/L (ref 39–117)
ALT: 32 U/L (ref 0–53)
AST: 34 U/L (ref 0–37)
Albumin: 4 g/dL (ref 3.5–5.2)
BUN: 24 mg/dL — ABNORMAL HIGH (ref 6–23)
CHLORIDE: 107 meq/L (ref 96–112)
CO2: 24 mEq/L (ref 19–32)
Calcium: 9.4 mg/dL (ref 8.4–10.5)
Creatinine, Ser: 1.13 mg/dL (ref 0.40–1.50)
GFR: 66.6 mL/min (ref >60.00)
Glucose, Bld: 94 mg/dL (ref 70–99)
POTASSIUM: 3.7 meq/L (ref 3.5–5.1)
Sodium: 141 mEq/L (ref 135–145)
TOTAL PROTEIN: 7.1 g/dL (ref 6.0–8.3)
Total Bilirubin: 0.3 mg/dL (ref 0.2–1.2)

## 2017-02-23 LAB — LIPASE: Lipase: 7 U/L — ABNORMAL LOW (ref 11.0–59.0)

## 2017-02-23 NOTE — Progress Notes (Signed)
    History of Present Illness: This is a 76 year old male complaining of 2 weeks of abdominal pain.  He is accompanied by his wife.  He developed lower abdominal pain and was treated empirically with a 10-day course of Augmentin for a presumed diverticulitis by his PCP.  His glycopyrrolate was  stopped.  With persistent symptoms he underwent a CT scan of the abdomen and pelvis as below.  A 10-day course of Cipro and Flagyl were prescribed.  The patient complains of intermittent lower abdominal pain and upper abdominal pain associated with "gurgling".  He has 2-3 bowel movements per day and this pattern has not changed.  IMPRESSION: 1. Diverticular changes with mild inflammation in the proximal sigmoid colon suggesting diverticulitis without complicating features. 2. Stable moderate hiatal hernia. 3. Stable hepatic and renal cystic disease. 4. Moderate degenerative changes of the lumbar spine as described.  Current Medications, Allergies, Past Medical History, Past Surgical History, Family History and Social History were reviewed in [employer] record.  Physical Exam: General: Well developed, well nourished, no acute distress Head: Normocephalic and atraumatic Eyes:  sclerae anicteric, EOMI Ears: Normal auditory acuity Mouth: No deformity or lesions Lungs: Clear throughout to auscultation Heart: Regular rate and rhythm; no murmurs, rubs or bruits Abdomen: Soft, non tender and non distended. No masses, hepatosplenomegaly or hernias noted. Normal Bowel sounds Rectal: not done Musculoskeletal: Symmetrical with no gross deformities  Pulses:  Normal pulses noted Extremities: No clubbing, cyanosis, edema or deformities noted Neurological: Alert oriented x 4, grossly nonfocal Psychological:  Alert and cooperative. Normal mood and affect  Assessment and Recommendations:  1. Diverticulitis, resolving.  Complete 10 days of Cipro and Flagyl as prescribed. CBC, CMP, lipase  today.  See #2.  If symptoms persist after antibiotics are completed and after glycopyrrolate is resumed consider repeat abdominal/pelvic CT scan.  2.  IBS with active symptoms. Resume glycopyrrolate 2 mg po bid for long term use.    3.  GERD and Barrett's esophagus.  Follow standard antireflux measures.  Continue pantoprazole 40 mg daily.

## 2017-02-23 NOTE — Patient Instructions (Addendum)
Resume Robinul twice daily.   Finish your antibiotics: Cipro and Flagyl.   Your physician has requested that you go to the basement for lab work before leaving today.   Call back early of next week if your symptoms have not improved.   Thank you for choosing me and [facility].  Pricilla Riffle. Dagoberto Ligas., MD., [facility] Regal

## 2017-03-15 ENCOUNTER — Telehealth: Payer: Self-pay | Admitting: Gastroenterology

## 2017-03-15 DIAGNOSIS — R1011 Right upper quadrant pain: Secondary | ICD-10-CM

## 2017-03-15 NOTE — Telephone Encounter (Signed)
Patient reports that he has pain in his RUQ mostly after meals.  He states that the LLQ pain has resolved and glycopyrrolate does not help with this pain.  He is taking pantoprazole BID.  Please advise

## 2017-03-15 NOTE — Telephone Encounter (Signed)
RUQ US.  

## 2017-03-16 NOTE — Telephone Encounter (Signed)
[place] scheduled for 03/22/17 9:30 Patient notified to arrive in radiology at 9:15 at [facility] and be NPO after midnight.

## 2017-03-22 ENCOUNTER — Telehealth: Payer: Self-pay | Admitting: Gastroenterology

## 2017-03-22 ENCOUNTER — Ambulatory Visit (HOSPITAL_COMMUNITY)
Admission: RE | Admit: 2017-03-22 | Discharge: 2017-03-22 | Disposition: A | Discharge status: Home / Self Care | Payer: Medicare Other | Source: Ambulatory Visit | Attending: Gastroenterology | Admitting: Gastroenterology

## 2017-03-22 DIAGNOSIS — R1011 Right upper quadrant pain: Secondary | ICD-10-CM | POA: Insufficient documentation

## 2017-03-22 DIAGNOSIS — N281 Cyst of kidney, acquired: Secondary | ICD-10-CM | POA: Insufficient documentation

## 2017-03-22 NOTE — Telephone Encounter (Signed)
Patient notified of the results .  See [place] results for details

## 2018-02-09 ENCOUNTER — Other Ambulatory Visit (HOSPITAL_BASED_OUTPATIENT_CLINIC_OR_DEPARTMENT_OTHER): Payer: Self-pay | Admitting: Family Medicine

## 2018-02-09 DIAGNOSIS — R1084 Generalized abdominal pain: Secondary | ICD-10-CM

## 2018-02-13 ENCOUNTER — Ambulatory Visit (HOSPITAL_BASED_OUTPATIENT_CLINIC_OR_DEPARTMENT_OTHER)
Admission: RE | Admit: 2018-02-13 | Discharge: 2018-02-13 | Disposition: A | Discharge status: Home / Self Care | Payer: Medicare Other | Source: Ambulatory Visit | Attending: Family Medicine | Admitting: Family Medicine

## 2018-02-13 DIAGNOSIS — N281 Cyst of kidney, acquired: Secondary | ICD-10-CM | POA: Diagnosis not present

## 2018-02-13 DIAGNOSIS — K7689 Other specified diseases of liver: Secondary | ICD-10-CM | POA: Insufficient documentation

## 2018-02-13 DIAGNOSIS — R911 Solitary pulmonary nodule: Secondary | ICD-10-CM | POA: Diagnosis not present

## 2018-02-13 DIAGNOSIS — K5730 Diverticulosis of large intestine without perforation or abscess without bleeding: Secondary | ICD-10-CM | POA: Diagnosis not present

## 2018-02-13 DIAGNOSIS — R1084 Generalized abdominal pain: Secondary | ICD-10-CM | POA: Diagnosis not present

## 2018-02-13 DIAGNOSIS — K449 Diaphragmatic hernia without obstruction or gangrene: Secondary | ICD-10-CM | POA: Insufficient documentation

## 2018-02-13 MED ORDER — IOPAMIDOL (ISOVUE-300) INJECTION 61%
100.0000 mL | Freq: Once | INTRAVENOUS | Status: AC | PRN
  Administered 2018-02-13: 100 mL via INTRAVENOUS

## 2018-02-15 ENCOUNTER — Other Ambulatory Visit: Payer: Self-pay | Admitting: Physician Assistant

## 2018-02-20 ENCOUNTER — Encounter: Payer: Self-pay | Admitting: Gastroenterology

## 2018-02-20 ENCOUNTER — Ambulatory Visit: Payer: Medicare Other | Admitting: Gastroenterology

## 2018-02-20 VITALS — BP 120/80 | HR 72 | Ht 70.0 in | Wt 197.4 lb

## 2018-02-20 DIAGNOSIS — K2270 Barrett's esophagus without dysplasia: Secondary | ICD-10-CM | POA: Diagnosis not present

## 2018-02-20 DIAGNOSIS — R1032 Left lower quadrant pain: Secondary | ICD-10-CM | POA: Diagnosis not present

## 2018-02-20 DIAGNOSIS — K5730 Diverticulosis of large intestine without perforation or abscess without bleeding: Secondary | ICD-10-CM | POA: Diagnosis not present

## 2018-02-20 DIAGNOSIS — K219 Gastro-esophageal reflux disease without esophagitis: Secondary | ICD-10-CM | POA: Diagnosis not present

## 2018-02-20 DIAGNOSIS — K589 Irritable bowel syndrome without diarrhea: Secondary | ICD-10-CM

## 2018-02-20 MED ORDER — PANTOPRAZOLE SODIUM 40 MG PO TBEC: 40.0000 mg | DELAYED_RELEASE_TABLET | Freq: Two times a day (BID) | ORAL | 11 refills | Status: DC

## 2018-02-20 MED ORDER — CILIDINIUM-CHLORDIAZEPOXIDE 2.5-5 MG PO CAPS: 1.0000 | ORAL_CAPSULE | Freq: Three times a day (TID) | ORAL | 3 refills | Status: DC | PRN

## 2018-02-20 MED ORDER — GLYCOPYRROLATE 2 MG PO TABS: 2.0000 mg | ORAL_TABLET | Freq: Two times a day (BID) | ORAL | 11 refills | Status: DC

## 2018-02-20 NOTE — Progress Notes (Signed)
    History of Present Illness: This is a 77 year old male complaining of left-sided abdominal pain, intermittent bloating and distention.  He is accompanied by his wife.  Due to worsening left lower quadrant abdominal pain he was treated empirically for diverticulitis by his PCP with a 10-day course of Augmentin.  Symptoms improved however they persisted persisted and an abdominal pelvic CT was performed as outlined below.  He states that he sometimes forgets to take his second daily dose of Robinul.  His PCP added Librax as needed which has helped his GI symptoms.  He notes that stress worsens his abdominal pain.  He denies diarrhea or constipation at this time however he has had diarrhea in the past.  He states he has had frequent heartburn despite remaining on Protonix daily.  Abdominal pelvic CT 02/13/2018 IMPRESSION: 6 mm nodule is noted in right lower lobe. Non-contrast chest CT at 6-12 months is recommended. If the nodule is stable at time of repeat CT, then future CT at 18-24 months (from today's scan) is considered optional for low-risk patients, but is recommended for high-risk patients. This recommendation follows the consensus statement: Guidelines for Management of Incidental Pulmonary Nodules Detected on CT Images: From the Fleischner Society 2017; Radiology 2017; 284:228-243.  Sigmoid diverticulosis without inflammation.  Stable hepatic and renal cysts.  Small sliding-type hiatal hernia.  No acute abnormality seen in the abdomen or pelvis.  Aortic Atherosclerosis (ICD10-I70.0).   Colonoscopy 10/2016 - Two 6 mm polyps in the transverse colon and in the cecum, removed with a cold snare. Resected and retrieved. - Mild diverticulosis in the transverse colon and in the cecum.  - Severe diverticulosis in the left colon. There was narrowing of the colon in association with the diverticular opening. There was evidence of diverticular spasm. There was evidence of an impacted  diverticulum.  - The examination was otherwise normal on direct and retroflexion views.  Current Medications, Allergies, Past Medical History, Past Surgical History, Family History and Social History were reviewed in [employer] record.   Physical Exam: General: Well developed, well nourished, no acute distress Head: Normocephalic and atraumatic Eyes:  sclerae anicteric, EOMI Ears: Normal auditory acuity Mouth: No deformity or lesions Lungs: Clear throughout to auscultation Heart: Regular rate and rhythm; no murmurs, rubs or bruits Abdomen: Soft, mild LLQ tenderness and non distended. No masses, hepatosplenomegaly or hernias noted. Normal Bowel sounds Rectal: Not done  Musculoskeletal: Symmetrical with no gross deformities  Pulses:  Normal pulses noted Extremities: No clubbing, cyanosis, edema or deformities noted Neurological: Alert oriented x 4, grossly nonfocal Psychological:  Alert and cooperative. Normal mood and affect   Assessment and Recommendations:  1. IBS with LLQ pain, bloating.  Severe diverticulosis.  Unclear if recent exacerbation of abdominal pain was mild diverticulitis that was resolved with the course of Augmentin or an IBS flare.  Continue Robinul 2 mg twice daily every day as a maintenance medication.  Begin the use of MiraLAX daily if constipation develops.  Librax 3 times daily as needed for breakthrough symptoms. REV in 1 year.   2. GERD with short segment Barrett's without dysplasia.  Intensify antireflux measures.  Increase Protonix to 40 mg twice daily.  Patient is advised to call in 1 month if his reflux symptoms are not adequately controlled with this regimen.  No plans for future surveillance endoscopies due to age.  3.  Personal history of adenomatous colon polyps.  No plans for future surveillance colonoscopies due to age.

## 2018-02-20 NOTE — Patient Instructions (Signed)
We have sent the following medications to your pharmacy for you to pick up at your convenience:Librax, robinul and pantoprazole.   Normal BMI (Body Mass Index- based on height and weight) is between 23 and 30. Your BMI today is Body mass index is 28.32 kg/m. Marland Kitchen Please consider follow up  regarding your BMI with your Primary Care Provider.  Thank you for choosing me and [facility].  Pricilla Riffle. Dagoberto Ligas., MD., Marval Regal

## 2019-02-13 ENCOUNTER — Encounter: Payer: Self-pay | Admitting: *Deleted

## 2019-02-19 ENCOUNTER — Ambulatory Visit: Payer: Medicare Other | Admitting: Cardiology

## 2019-05-01 NOTE — Progress Notes (Signed)
Samuel Everts, MD Reason for referral-Dyspnea  HPI: 79 year old male for evaluation of dyspnea at request of Orpah Melter, MD.  Seen previously but not since July 2017. Echocardiogram echocardiogram June 2017 showed normal LV function, severe left atrial enlargement and mild right atrial enlargement.  Nuclear study July 2017 showed ejection fraction 57% and no ischemia or infarction.  Abdominal CT October 2019 showed aortic atherosclerosis but no aneurysm.  There was a 6 mm nodule in the right lower lobe and follow-up recommended 6 to 12 months.  Patient has dyspnea with more vigorous activities but not routine activities.  No orthopnea, PND, pedal edema, exertional chest pain or syncope.  Current Outpatient Medications  Medication Sig Dispense Refill  . amLODipine (NORVASC) 5 MG tablet Take 5 mg by mouth daily.    Marland Kitchen aspirin 81 MG tablet Take 81 mg by mouth daily.    . Cholecalciferol (VITAMIN D3) 2000 UNITS TABS Take 1 tablet by mouth daily.     . clidinium-chlordiazePOXIDE (LIBRAX) 5-2.5 MG capsule Take 1 capsule by mouth 3 (three) times daily as needed. (Patient taking differently: Take 1 capsule by mouth as needed. ) 30 capsule 3  . glycopyrrolate (ROBINUL) 2 MG tablet Take 1 tablet (2 mg total) by mouth 2 (two) times daily. 60 tablet 11  . hydrochlorothiazide (HYDRODIURIL) 25 MG tablet Take 25 mg by mouth daily.    . pantoprazole (PROTONIX) 40 MG tablet Take 1 tablet (40 mg total) by mouth 2 (two) times daily. 60 tablet 11  . Turmeric 500 MG TABS Take 1 tablet by mouth daily.    . vitamin E (VITAMIN E) 400 UNIT capsule Take 400 Units by mouth daily.    . Zinc 50 MG TABS Take 1 tablet by mouth daily.     No current facility-administered medications for this visit.    Allergies  Allergen Reactions  . Codeine     REACTION: Agitation     Past Medical History:  Diagnosis Date  . Arthritis   . Diverticulosis   . GERD (gastroesophageal reflux disease)   . Heart  murmur   . Hemorrhoids   . Hiatal hernia   . Hypertension   . Tubular adenoma of colon 01/2009    Past Surgical History:  Procedure Laterality Date  . COLONOSCOPY    . HERNIA REPAIR    . POLYPECTOMY    . REPLACEMENT TOTAL KNEE  02/14/11   left knee    Social History   Socioeconomic History  . Marital status: Married    Spouse name: Not on file  . Number of children: 4  . Years of education: Not on file  . Highest education level: Not on file  Occupational History  . Not on file  Tobacco Use  . Smoking status: Former Smoker    Quit date: 02/21/1964    Years since quitting: 55.2  . Smokeless tobacco: Never Used  Substance and Sexual Activity  . Alcohol use: No  . Drug use: No  . Sexual activity: Not on file  Other Topics Concern  . Not on file  Social History Narrative  . Not on file   Social Determinants of Health   Financial Resource Strain:   . Difficulty of Paying Living Expenses: Not on file  Food Insecurity:   . Worried About Charity fundraiser in the Last Year: Not on file  . Ran Out of Food in the Last Year: Not on file  Transportation Needs:   . Lack of  Transportation (Medical): Not on file  . Lack of Transportation (Non-Medical): Not on file  Physical Activity:   . Days of Exercise per Week: Not on file  . Minutes of Exercise per Session: Not on file  Stress:   . Feeling of Stress : Not on file  Social Connections:   . Frequency of Communication with Friends and Family: Not on file  . Frequency of Social Gatherings with Friends and Family: Not on file  . Attends Religious Services: Not on file  . Active Member of Clubs or Organizations: Not on file  . Attends Archivist Meetings: Not on file  . Marital Status: Not on file  Intimate Partner Violence:   . Fear of Current or Ex-Partner: Not on file  . Emotionally Abused: Not on file  . Physically Abused: Not on file  . Sexually Abused: Not on file    Family History  Problem Relation  Age of Onset  . Breast cancer Sister   . Pancreatic cancer Brother   . Colon cancer Neg Hx   . Esophageal cancer Neg Hx   . Rectal cancer Neg Hx   . Stomach cancer Neg Hx     ROS: no fevers or chills, productive cough, hemoptysis, dysphasia, odynophagia, melena, hematochezia, dysuria, hematuria, rash, seizure activity, orthopnea, PND, pedal edema, claudication. Remaining systems are negative.  Physical Exam:   Blood pressure 130/72, pulse 70, temperature 97.7 F (36.5 C), height 5\' 11"  (1.803 m), weight 197 lb 6.4 oz (89.5 kg), SpO2 94 %.  General:  Well developed/well nourished in NAD Skin warm/dry Patient not depressed No peripheral clubbing Back-normal HEENT-normal/normal eyelids Neck supple/normal carotid upstroke bilaterally; no bruits; no JVD; no thyromegaly chest - CTA/ normal expansion CV - RRR/normal S1 and S2; no rubs or gallops;  PMI nondisplaced; 2/6 systolic murmur left sternal border.  No diastolic murmur. Abdomen -NT/ND, no HSM, no mass, + bowel sounds, no bruit 2+ femoral pulses, no bruits Ext-no edema, chords, 2+ DP Neuro-grossly nonfocal  ECG -sinus rhythm at a rate of 70, first-degree AV block, no ST changes.  Personally reviewed  A/P  1 dyspnea-not volume overloaded on examination.  We will repeat echocardiogram.  2 hypertension-blood pressure controlled.  Continue present medications and follow.  3 lung nodule-we will arrange follow-up noncontrast chest CT.  4 murmur-plan repeat echocardiogram.  May have mild aortic stenosis.  Kirk Ruths, MD

## 2019-05-07 ENCOUNTER — Other Ambulatory Visit: Payer: Self-pay

## 2019-05-07 ENCOUNTER — Encounter: Payer: Self-pay | Admitting: Cardiology

## 2019-05-07 ENCOUNTER — Ambulatory Visit: Payer: Medicare Other | Admitting: Cardiology

## 2019-05-07 VITALS — BP 130/72 | HR 70 | Temp 97.7°F | Ht 71.0 in | Wt 197.4 lb

## 2019-05-07 DIAGNOSIS — R0602 Shortness of breath: Secondary | ICD-10-CM

## 2019-05-07 DIAGNOSIS — I10 Essential (primary) hypertension: Secondary | ICD-10-CM

## 2019-05-07 DIAGNOSIS — R011 Cardiac murmur, unspecified: Secondary | ICD-10-CM | POA: Diagnosis not present

## 2019-05-07 DIAGNOSIS — R918 Other nonspecific abnormal finding of lung field: Secondary | ICD-10-CM | POA: Diagnosis not present

## 2019-05-07 NOTE — Patient Instructions (Addendum)
Medication Instructions:  NO CHANGE *If you need a refill on your cardiac medications before your next appointment, please call your pharmacy*  Lab Work: If you have labs (blood work) drawn today and your tests are completely normal, you will receive your results only by: Marland Kitchen MyChart Message (if you have MyChart) OR . A paper copy in the mail If you have any lab test that is abnormal or we need to change your treatment, we will call you to review the results.  Testing/Procedures: Your physician has requested that you have an echocardiogram. Echocardiography is a painless test that uses sound waves to create images of your heart. It provides your doctor with information about the size and shape of your heart and how well your heart's chambers and valves are working. This procedure takes approximately one hour. There are no restrictions for this procedure.[place]  CT OF CHEST WO CONTRAST=[address] [facility]    Follow-Up: At [facility], you and your health needs are our priority.  As part of our continuing mission to provide you with exceptional heart care, we have created designated Provider Care Teams.  These Care Teams include your primary Cardiologist (physician) and Advanced Practice Providers (APPs -  Physician Assistants and Nurse Practitioners) who all work together to provide you with the care you need, when you need it.  Your next appointment:   12 month(s)  The format for your next appointment:   Either In Person or Virtual  Provider:   You may see Kirk Ruths MD or one of the following Advanced Practice Providers on your designated Care Team:    Kerin Ransom, PA-C  [place], [place]  Coletta Memos, [place]

## 2019-05-07 NOTE — Addendum Note (Signed)
Addended by: Jacqulynn Cadet on: 05/07/2019 04:05 PM   Modules accepted: Orders

## 2019-05-10 ENCOUNTER — Other Ambulatory Visit: Payer: Self-pay | Admitting: Gastroenterology

## 2019-05-15 ENCOUNTER — Ambulatory Visit
Admission: RE | Admit: 2019-05-15 | Discharge: 2019-05-15 | Disposition: A | Discharge status: Home / Self Care | Payer: Medicare Other | Source: Ambulatory Visit | Attending: Cardiology | Admitting: Cardiology

## 2019-05-15 DIAGNOSIS — R918 Other nonspecific abnormal finding of lung field: Secondary | ICD-10-CM

## 2019-05-17 ENCOUNTER — Other Ambulatory Visit: Payer: Self-pay

## 2019-05-17 ENCOUNTER — Ambulatory Visit (HOSPITAL_COMMUNITY): Payer: Medicare Other | Attending: Cardiology

## 2019-05-17 DIAGNOSIS — R0602 Shortness of breath: Secondary | ICD-10-CM | POA: Insufficient documentation

## 2019-05-20 ENCOUNTER — Other Ambulatory Visit: Payer: Self-pay | Admitting: *Deleted

## 2019-05-20 DIAGNOSIS — Q446 Cystic disease of liver: Secondary | ICD-10-CM

## 2019-05-27 ENCOUNTER — Ambulatory Visit
Admission: RE | Admit: 2019-05-27 | Discharge: 2019-05-27 | Disposition: A | Discharge status: Home / Self Care | Payer: Medicare Other | Source: Ambulatory Visit | Attending: Cardiology | Admitting: Cardiology

## 2019-05-27 DIAGNOSIS — Q446 Cystic disease of liver: Secondary | ICD-10-CM

## 2019-07-19 ENCOUNTER — Other Ambulatory Visit: Payer: Self-pay | Admitting: Gastroenterology

## 2019-09-30 ENCOUNTER — Other Ambulatory Visit: Payer: Self-pay | Admitting: Gastroenterology

## 2019-11-20 ENCOUNTER — Telehealth: Payer: Self-pay | Admitting: Gastroenterology

## 2019-11-20 MED ORDER — GLYCOPYRROLATE 2 MG PO TABS: 2.0000 mg | ORAL_TABLET | Freq: Two times a day (BID) | ORAL | 0 refills | Status: DC

## 2019-11-20 NOTE — Telephone Encounter (Signed)
Informed Samuel [name] at [facility] that we can send the refill to the pharmacy for patient but patient has not been seen in over a year and needs to keep appt for further refills. Office verbalized understanding.

## 2019-12-12 ENCOUNTER — Encounter: Payer: Self-pay | Admitting: Physician Assistant

## 2019-12-12 ENCOUNTER — Ambulatory Visit: Payer: Medicare Other | Admitting: Physician Assistant

## 2019-12-12 VITALS — BP 124/68 | HR 78 | Ht 71.0 in | Wt 195.4 lb

## 2019-12-12 DIAGNOSIS — K5790 Diverticulosis of intestine, part unspecified, without perforation or abscess without bleeding: Secondary | ICD-10-CM

## 2019-12-12 DIAGNOSIS — Z8719 Personal history of other diseases of the digestive system: Secondary | ICD-10-CM

## 2019-12-12 DIAGNOSIS — K589 Irritable bowel syndrome without diarrhea: Secondary | ICD-10-CM

## 2019-12-12 MED ORDER — GLYCOPYRROLATE 2 MG PO TABS: 2.0000 mg | ORAL_TABLET | Freq: Two times a day (BID) | ORAL | 11 refills | Status: DC

## 2019-12-12 MED ORDER — CILIDINIUM-CHLORDIAZEPOXIDE 2.5-5 MG PO CAPS: 1.0000 | ORAL_CAPSULE | Freq: Three times a day (TID) | ORAL | 5 refills | Status: DC | PRN

## 2019-12-12 MED ORDER — PANTOPRAZOLE SODIUM 40 MG PO TBEC: 40.0000 mg | DELAYED_RELEASE_TABLET | Freq: Two times a day (BID) | ORAL | 3 refills | Status: DC

## 2019-12-12 NOTE — Patient Instructions (Signed)
If you are age 81 or older, your body mass index should be between 23-30. Your Body mass index is 27.25 kg/m. If this is out of the aforementioned range listed, please consider follow up with your Primary Care Provider.  If you are age 82 or younger, your body mass index should be between 19-25. Your Body mass index is 27.25 kg/m. If this is out of the aformentioned range listed, please consider follow up with your Primary Care Provider.   Refills have been sent to your pharmacy for Glycopyrrolate, Pantoprazole and Librax.  Call the office and speak with a nurse if you have any issues.  Follow up as needed with Amy Esterwood PA-C or Dr. Fuller Plan.

## 2019-12-12 NOTE — Progress Notes (Signed)
Subjective:    Patient ID: Samuel [name], male    DOB: 03/25/1941, 79 y.o.   MRN: [id number]  HPI Samuel [name] is a pleasant 79 year old white male, known to Dr. Fuller Plan, with history of symptomatic diverticulosis, IBS and diverticulitis.  Also with history of Barrett's esophagus, and GERD. Patient was last seen in the office in 2019.  At that time he was placed on glycopyrrolate twice daily and also has a as needed prescription for Librax.  He is maintained on twice daily Protonix. Patient had called recently after onset about 3 weeks ago with left lower quadrant pain which he felt was consistent with diverticulitis.  He was unable to be seen here, had been seen by his PCP and treated with a 7-day course of Augmentin.  He says he feels much better at this point in the left lower quadrant pain has resolved.  He did not have any associated fever or chills, no nausea or vomiting and no change in bowel habits.  He has bothered intermittently with lower abdominal discomfort and has some chronic left lower quadrant discomfort but says the episodes of diverticulitis are associated with a sharper ongoing pain. He has no current complaints of constipation.  He needs refills on Robinul Forte and would also like refill for Librax which she says he usually will just use if he gets very upset and has an episode of abdominal spasm.  He says he can take 2 or 3 of those in a day which seems to settle his symptoms and does not require this on any sort of regular basis. He is doing well on twice daily Protonix with good control of GERD. Last colonoscopy July 2018 with removal of 2 6 mm polyps and noted to have severe diverticulosis in the left colon with narrowing and spasm.  Path on the polyps consistent with tubular adenomas.  Review of Systems Pertinent positive and negative review of systems were noted in the above HPI section.  All other review of systems was otherwise negative.  Outpatient Encounter Medications as of  12/12/2019  Medication Sig  . amLODipine (NORVASC) 5 MG tablet Take 5 mg by mouth daily.  Marland Kitchen aspirin 81 MG tablet Take 81 mg by mouth daily.  . Cholecalciferol (VITAMIN D3) 2000 UNITS TABS Take 1 tablet by mouth daily.   . clidinium-chlordiazePOXIDE (LIBRAX) 5-2.5 MG capsule Take 1 capsule by mouth 3 (three) times daily as needed (severe cramping).  Marland Kitchen glycopyrrolate (ROBINUL) 2 MG tablet Take 1 tablet (2 mg total) by mouth 2 (two) times daily.  . hydrochlorothiazide (HYDRODIURIL) 25 MG tablet Take 25 mg by mouth daily.  . pantoprazole (PROTONIX) 40 MG tablet Take 1 tablet (40 mg total) by mouth 2 (two) times daily.  . Turmeric 500 MG TABS Take 1 tablet by mouth daily.  . vitamin E (VITAMIN E) 400 UNIT capsule Take 400 Units by mouth daily.  . Zinc 50 MG TABS Take 1 tablet by mouth daily.  . [DISCONTINUED] clidinium-chlordiazePOXIDE (LIBRAX) 5-2.5 MG capsule Take 1 capsule by mouth 3 (three) times daily as needed. (Patient taking differently: Take 1 capsule by mouth as needed. )  . [DISCONTINUED] glycopyrrolate (ROBINUL) 2 MG tablet Take 1 tablet (2 mg total) by mouth 2 (two) times daily.  . [DISCONTINUED] pantoprazole (PROTONIX) 40 MG tablet Take 1 tablet (40 mg total) by mouth 2 (two) times daily.   No facility-administered encounter medications on file as of 12/12/2019.   Allergies  Allergen Reactions  . Codeine  REACTION: Agitation   Patient Active Problem List   Diagnosis Date Noted  . Hx of diverticulitis of colon 12/12/2019  . Barrett's esophagus without dysplasia 02/18/2015  . Chest pain 07/29/2013  . Diverticulosis 02/21/2012  . HTN (hypertension) 02/21/2012  . GERD (gastroesophageal reflux disease) 02/21/2012   Social History   Socioeconomic History  . Marital status: Married    Spouse name: Not on file  . Number of children: 4  . Years of education: Not on file  . Highest education level: Not on file  Occupational History  . Not on file  Tobacco Use  . Smoking  status: Former Smoker    Quit date: 02/21/1964    Years since quitting: 55.8  . Smokeless tobacco: Never Used  Vaping Use  . Vaping Use: Never used  Substance and Sexual Activity  . Alcohol use: No  . Drug use: No  . Sexual activity: Not on file  Other Topics Concern  . Not on file  Social History Narrative  . Not on file   Social Determinants of Health   Financial Resource Strain:   . Difficulty of Paying Living Expenses: Not on file  Food Insecurity:   . Worried About Charity fundraiser in the Last Year: Not on file  . Ran Out of Food in the Last Year: Not on file  Transportation Needs:   . Lack of Transportation (Medical): Not on file  . Lack of Transportation (Non-Medical): Not on file  Physical Activity:   . Days of Exercise per Week: Not on file  . Minutes of Exercise per Session: Not on file  Stress:   . Feeling of Stress : Not on file  Social Connections:   . Frequency of Communication with Friends and Family: Not on file  . Frequency of Social Gatherings with Friends and Family: Not on file  . Attends Religious Services: Not on file  . Active Member of Clubs or Organizations: Not on file  . Attends Archivist Meetings: Not on file  . Marital Status: Not on file  Intimate Partner Violence:   . Fear of Current or Ex-Partner: Not on file  . Emotionally Abused: Not on file  . Physically Abused: Not on file  . Sexually Abused: Not on file    Samuel [name]'s family history includes Breast cancer in his sister; Pancreatic cancer in his brother.      Objective:    Vitals:   12/12/19 1047  BP: 124/68  Pulse: 78    Physical Exam Well-developed well-nourished elderly white male in no acute distress.  Height, Weight 195, BMI 27.2 accompanied by wife  HEENT; nontraumatic normocephalic, EOMI, PE RR R LA, sclera anicteric. Oropharynx; not examined Neck; supple, no JVD Cardiovascular; regular rate and rhythm with S1-S2, no murmur rub or gallop Pulmonary;  Clear bilaterally Abdomen; soft, nontender, nondistended, no palpable mass or hepatosplenomegaly, bowel sounds are active Rectal; not done today Skin; benign exam, no jaundice rash or appreciable lesions Extremities; no clubbing cyanosis or edema skin warm and dry Neuro/Psych; alert and oriented x4, grossly nonfocal mood and affect appropriate       Assessment & Plan:   #61 79 year old white male with symptomatic diverticulosis, IBS and recurrent diverticulitis. Patient had a recent episode of diverticulitis now resolved after a course of Augmentin. He is currently feeling well. Underlying IBS managed with Robinul Forte 2 mg twice daily. Occasional episodes of more intense cramping spasm managed with as needed Librax long-term  #2 GERD  stable on twice daily Protonix 40 mg #3.  History of Barrett's esophagus #4.  History of adenomatous colon polyps-last colonoscopy July 2018  Plan; continue high-fiber diet. Refill Robinul Forte 2 mg p.o. twice daily x1 year Cap refill Librax 1 p.o. every 6-8 hours as needed severe abdominal cramping. Continue Protonix 40 mg p.o. twice daily, refill x1 year. Patient advised to call should he have any recurrence of left lower quadrant pain or concerns with diverticulitis.  I asked him to request to speak to the nurse if he is symptomatic so that we may be able to call in an antibiotic for him if he cannot be seen promptly. No follow-up with Dr. Fuller Plan or myself in 1 year or sooner as needed  Samuel [name] Genia Harold PA-C 12/12/2019   Cc: Orpah Melter, MD

## 2019-12-15 NOTE — Progress Notes (Signed)
Reviewed and agree with management plan.  Natividad Schlosser T. Dennys Guin, MD FACG [facility]  

## 2019-12-30 ENCOUNTER — Telehealth: Payer: Self-pay | Admitting: Physician Assistant

## 2019-12-30 NOTE — Telephone Encounter (Signed)
A new prior authorization has been started.

## 2019-12-31 NOTE — Telephone Encounter (Signed)
I've tried to get this prior authorized twice. Both times it has come back denied. The insurance does not cover it at all and says that it is a Less that Effective Drug Efficacy Study Implementation Drug. P.lease advise

## 2020-01-01 NOTE — Telephone Encounter (Signed)
OK- please let pt no insurance will not pay for it - he can pay out of pocket or we can send RX ffor  Levsin SL for him to use prn for episodes of pain/ cramping  # 40/6

## 2020-01-02 NOTE — Telephone Encounter (Signed)
Patient has been advised and plans to check on the out of pocket price on the Librax. I advised to let me know which he wanted to do.

## 2020-05-12 DIAGNOSIS — I10 Essential (primary) hypertension: Secondary | ICD-10-CM | POA: Diagnosis not present

## 2020-05-12 DIAGNOSIS — K219 Gastro-esophageal reflux disease without esophagitis: Secondary | ICD-10-CM | POA: Diagnosis not present

## 2020-05-12 DIAGNOSIS — J449 Chronic obstructive pulmonary disease, unspecified: Secondary | ICD-10-CM | POA: Diagnosis not present

## 2020-05-25 DIAGNOSIS — K219 Gastro-esophageal reflux disease without esophagitis: Secondary | ICD-10-CM | POA: Diagnosis not present

## 2020-05-25 DIAGNOSIS — R109 Unspecified abdominal pain: Secondary | ICD-10-CM | POA: Diagnosis not present

## 2020-06-03 NOTE — Progress Notes (Signed)
Cardiology Clinic Note   Patient Name: Samuel [name] Date of Encounter: 06/04/2020  Primary Care Provider:  Orpah Melter, MD Primary Cardiologist:  Kirk Ruths, MD  Patient Profile    Samuel [name] 80 year old male presents the clinic today for follow-up evaluation of his shortness of breath.  Past Medical History    Past Medical History:  Diagnosis Date  . Arthritis   . Diverticulosis   . GERD (gastroesophageal reflux disease)   . Heart murmur   . Hemorrhoids   . Hiatal hernia   . Hypertension   . Tubular adenoma of colon 01/2009   Past Surgical History:  Procedure Laterality Date  . COLONOSCOPY    . HERNIA REPAIR    . POLYPECTOMY    . REPLACEMENT TOTAL KNEE  02/14/11   left knee    Allergies  Allergies  Allergen Reactions  . Codeine     REACTION: Agitation    History of Present Illness    Samuel [name] has a PMH of essential hypertension, cardiac murmur, dyspnea, and lung nodule.  He was initially referred by his PCP Christella Noa.  He was seen in initial evaluation 7/17.  His echocardiogram 6/17 showed normal LV function, severe left atrial enlargement, and mild right atrial enlargement.  A nuclear stress test 7/17 showed an EF of 57%, no ischemia and no infarction.  An abdominal CT 10/19 showed aortic atherosclerosis but no aneurysm.  A 6 mm nodule in the right upper lobe and follow-up imaging was recommended for 6-12 months.  He was last seen by Dr. Stanford Breed on 05/07/2019.  During that time he denied orthopnea, PND, lower extremity swelling, exertional chest pain, and syncope.  His follow-up CT showed a 4 mm right lower lobe nodule unchanged from previous scan.  The nodule was felt to be benign.  He presents the clinic today for follow-up evaluation states he occasionally notices chest discomfort in the evening after eating a big meal.  He reports he presented to his PCP who prescribed Pepcid along with his Protonix.  He feels that this is made some  difference.  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  He also reports that he is able to walk up and down 2 flights of stairs at church.  With these activities he does not have chest pain but notices increased work of breathing and decrease in his endurance.  He reports that he regularly drinks beet juice which has been helping with his blood pressure at home.  It has been ranging in the 120s-130s over 70s.  I will order a echocardiogram for further evaluation, continue his current medications, request his lab work from his PCP, give salty 6 diet sheet, and have him follow-up in a year.  Today he denies chest pain, shortness of breath, lower extremity edema, fatigue, palpitations, melena, hematuria, hemoptysis, diaphoresis, weakness, presyncope, syncope, orthopnea, and PND.   Home Medications    Prior to Admission medications   Medication Sig Start Date End Date Taking? Authorizing Provider  amLODipine (NORVASC) 5 MG tablet Take 5 mg by mouth daily.    [provider]  aspirin 81 MG tablet Take 81 mg by mouth daily.    [provider]  Cholecalciferol (VITAMIN D3) 2000 UNITS TABS Take 1 tablet by mouth daily.     [provider]  clidinium-chlordiazePOXIDE (LIBRAX) 5-2.5 MG capsule Take 1 capsule by mouth 3 (three) times  daily as needed (severe cramping). 12/12/19   Esterwood, Amy S, PA-C  glycopyrrolate (ROBINUL) 2 MG tablet Take 1 tablet (2 mg total) by mouth 2 (two) times daily. 12/12/19   Esterwood, Amy S, PA-C  hydrochlorothiazide (HYDRODIURIL) 25 MG tablet Take 25 mg by mouth daily.    [provider]  pantoprazole (PROTONIX) 40 MG tablet Take 1 tablet (40 mg total) by mouth 2 (two) times daily. 12/12/19   Esterwood, Amy S, PA-C  Turmeric 500 MG TABS Take 1 tablet by mouth daily.    [provider]  vitamin E (VITAMIN E) 400 UNIT capsule Take 400 Units by mouth daily.    [provider]  Zinc 50 MG TABS Take 1 tablet by mouth daily.    [provider]    Family History    Family History  Problem Relation Age of Onset  . Breast cancer Sister   . Pancreatic cancer Brother   . Colon cancer Neg Hx   . Esophageal cancer Neg Hx   . Rectal cancer Neg Hx   . Stomach cancer Neg Hx    He indicated that his mother is deceased. He indicated that his father is deceased. He indicated that his sister is deceased. He indicated that his brother is deceased. He indicated that the status of his neg hx is unknown.  Social History    Social History   Socioeconomic History  . Marital status: Married    Spouse name: Not on file  . Number of children: 4  . Years of education: Not on file  . Highest education level: Not on file  Occupational History  . Not on file  Tobacco Use  . Smoking status: Former Smoker    Quit date: 02/21/1964    Years since quitting: 56.3  . Smokeless tobacco: Never Used  Vaping Use  . Vaping Use: Never used  Substance and Sexual Activity  . Alcohol use: No  . Drug use: No  . Sexual activity: Not on file  Other Topics Concern  . Not on file  Social History Narrative  . Not on file   Social Determinants of Health   Financial Resource Strain: Not on file  Food Insecurity: Not on file  Transportation Needs: Not on file  Physical Activity: Not on file  Stress: Not on file  Social Connections: Not on file  Intimate Partner Violence: Not on file     Review of Systems    General:  No chills, fever, night sweats or weight changes.  Cardiovascular:  No chest pain, dyspnea on exertion, edema, orthopnea, palpitations, paroxysmal nocturnal dyspnea. Dermatological: No rash, lesions/masses Respiratory: No cough, dyspnea Urologic: No hematuria, dysuria Abdominal:   No nausea, vomiting, diarrhea, bright red blood per rectum, melena, or hematemesis Neurologic:  No visual changes, wkns, changes in mental status. All other systems  reviewed and are otherwise negative except as noted above.  Physical Exam    VS:  BP (!) 162/76   Pulse 72   Ht 5\' 11"  (1.803 m)   Wt 199 lb 6.4 oz (90.4 kg)   SpO2 95%   BMI 27.81 kg/m  , BMI Body mass index is 27.81 kg/m. GEN: Well nourished, well developed, in no acute distress. HEENT: normal. Neck: Supple, no JVD, carotid bruits, or masses. Cardiac: RRR, 2/6 systolic murmur heard best along left sternal border.  Murmurs, rubs, or gallops. No clubbing, cyanosis, edema.  Radials/DP/PT 2+ and equal bilaterally.  Respiratory:  Respirations regular and unlabored,  clear to auscultation bilaterally. GI: Soft, nontender, nondistended, BS + x 4. MS: no deformity or atrophy. Skin: warm and dry, no rash. Neuro:  Strength and sensation are intact. Psych: Normal affect.  Accessory Clinical Findings    Recent Labs: No results found for requested labs within last 8760 hours.   Recent Lipid Panel No results found for: CHOL, TRIG, HDL, CHOLHDL, VLDL, LDLCALC, LDLDIRECT  ECG personally reviewed by me today-sinus rhythm with marked sinus arrhythmia with first-degree block 72 bpm- No acute changes  Chest CT without contrast 05/15/2019 COMPARISON:  CT abdomen pelvis 02/13/2018 and CT chest 09/16/2008.  FINDINGS: Cardiovascular: Atherosclerotic calcification of the aorta, aortic valve and coronary arteries. Heart size normal. No pericardial effusion.  Mediastinum/Nodes: No pathologically enlarged mediastinal or axillary lymph nodes. Hilar regions are difficult to definitively evaluate without IV contrast but appear grossly unremarkable. Esophagus is somewhat dilated and fluid-filled.  Lungs/Pleura: Biapical pleuroparenchymal scarring. Centrilobular and paraseptal emphysema. Mild scarring in the peripheral lower lobes. 4 mm right lower lobe nodule (8/78), unchanged from 09/16/2008. Calcified granulomas. No pleural fluid. Airway is unremarkable.  Upper Abdomen: Low-attenuation  lesions in the liver measure up to 3.1 cm in the left hepatic lobe, similar and likely cysts. Visualized portions of the liver, gallbladder and adrenal glands are otherwise unremarkable. 4.7 cm low-attenuation lesion off the posterior right kidney is likely a cyst. Visualized portions of the kidneys, spleen, pancreas, stomach and bowel are otherwise unremarkable with the exception of a moderate hiatal hernia. Soft tissue lesion along the periphery of the right pararenal space (2/143), unchanged and likely a benign pseudotumor.  Musculoskeletal: Degenerative changes in the spine. No worrisome lytic or sclerotic lesions. Flowing anterior osteophytosis in the thoracic spine.  IMPRESSION: 1. Right lower lobe nodule is unchanged from 09/16/2008 and is considered benign. No worrisome pulmonary nodules. 2. Esophagus is mildly dilated and fluid-filled, indicating dysmotility. 3. Moderate hiatal hernia. 4. Aortic atherosclerosis (ICD10-I70.0). Coronary artery calcification. 5.  Emphysema (ICD10-J43.9).  Echocardiogram 05/17/2019 IMPRESSIONS    1. Left ventricular ejection fraction, by visual estimation, is 60 to  65%. The left ventricle has normal function. There is moderately increased  left ventricular hypertrophy.  2. The left ventricle has no regional wall motion abnormalities.  3. Global right ventricle has normal systolic function.The right  ventricular size is normal. No increase in right ventricular wall  thickness.  4. Left atrial size was normal.  5. Right atrial size was normal.  6. Trivial pericardial effusion is present.  7. Cystic appearing structure in the liver. Recommend dedicated liver  imaging if clinically indicated.  8. The mitral valve is normal in structure. Trivial mitral valve  regurgitation.  9. The tricuspid valve is normal in structure.  10. The tricuspid valve is normal in structure. Tricuspid valve  regurgitation is trivial.  11. The aortic  valve is tricuspid. Aortic valve regurgitation is not  visualized. Mild aortic valve stenosis.  12. The pulmonic valve was grossly normal. Pulmonic valve regurgitation is  trivial.  13. Normal pulmonary artery systolic pressure.  14. The inferior vena cava is normal in size with greater than 50%  respiratory variability, suggesting right atrial pressure of 3 mmHg.   Assessment & Plan   1.  Dyspnea-chronic and stable.  Echocardiogram 05/17/2019 showed normal EF, and mild AS.  Reports increased dyspnea with increased physical activity.  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 Heart healthy low-sodium diet-salty 6 given Increase physical activity as tolerated Repeat echocardiogram  Cardiac  murmur-no increased DOE or activity intolerance.  Echocardiogram 05/17/2019 showed trivial mitral valve regurgitation and trivial pulmonic valve regurgitation with normal EF. Continue to monitor  Essential hypertension-BP today 162/76.  Well-controlled at home.  120s-130s over 70s Continue amlodipine, hydrochlorothiazide Heart healthy low-sodium diet-salty 6 given Increase physical activity as tolerated Request lab work from PCP  Lung nodule-follow-up chest CT showed benign stable lung nodule.  No change in respiration.  Disposition: Follow-up with Dr. Stanford Breed or me in 1 year.   Jossie Ng. Kiersten Coss NP-C    06/04/2020, 10:05 AM [place] [address] Office [phone] Fax [phone]  Notice: This dictation was prepared with Dragon dictation along with smaller phrase technology. Any transcriptional errors that result from this process are unintentional and may not be corrected upon review.  I spent 15 minutes examining this patient, reviewing medications, and using patient centered shared decision making involving her cardiac care.  Prior to her visit I spent greater than 20 minutes reviewing her past medical history,   medications, and prior cardiac tests.

## 2020-06-04 ENCOUNTER — Encounter: Payer: Self-pay | Admitting: General Practice

## 2020-06-04 ENCOUNTER — Other Ambulatory Visit: Payer: Self-pay

## 2020-06-04 ENCOUNTER — Ambulatory Visit: Payer: Medicare Other | Admitting: General Practice

## 2020-06-04 VITALS — BP 162/76 | HR 72 | Ht 71.0 in | Wt 199.4 lb

## 2020-06-04 DIAGNOSIS — I10 Essential (primary) hypertension: Secondary | ICD-10-CM

## 2020-06-04 DIAGNOSIS — R918 Other nonspecific abnormal finding of lung field: Secondary | ICD-10-CM | POA: Diagnosis not present

## 2020-06-04 DIAGNOSIS — R0602 Shortness of breath: Secondary | ICD-10-CM | POA: Diagnosis not present

## 2020-06-04 DIAGNOSIS — I350 Nonrheumatic aortic (valve) stenosis: Secondary | ICD-10-CM

## 2020-06-04 DIAGNOSIS — R011 Cardiac murmur, unspecified: Secondary | ICD-10-CM | POA: Diagnosis not present

## 2020-06-04 NOTE — Patient Instructions (Signed)
Medication Instructions:  The current medical regimen is effective;  continue present plan and medications as directed. Please refer to the Current Medication list given to you today.  *If you need a refill on your cardiac medications before your next appointment, please call your pharmacy*  Lab Work: [facility] [phone] If you have labs (blood work) drawn today and your tests are completely normal, you will receive your results only by:  [place] (if you have MyChart) OR A paper copy in the mail.  If you have any lab test that is abnormal or we need to change your treatment, we will call you to review the results. You may go to any [facility] that is convenient for you however, we do have a lab in our office that is able to assist you. You DO NOT need an appointment for our lab. The lab is open 8:00am and closes at 4:00pm. Lunch 12:45 - 1:45pm.  Testing/Procedures: Echocardiogram - Your physician has requested that you have an echocardiogram. Echocardiography is a painless test that uses sound waves to create images of your heart. It provides your doctor with information about the size and shape of your heart and how well your heart's chambers and valves are working. This procedure takes approximately one hour. There are no restrictions for this procedure. This will be performed at our [facility] location - [address], [address].   Special Instructions TAKE AND LOG YOUR BLOOD PRESSURE  PLEASE READ AND FOLLOW SALTY 6-ATTACHED-1,800 mg daily  PLEASE MAINTAIN PHYSICAL ACTIVITY AS TOLERATED  Follow-Up: Your next appointment:  12 month(s) In Person with You may see Kirk Ruths, MD OR IF UNAVAILABLE JESSE CLEAVER, FNP-C or one of the following Advanced Practice Providers on your designated Care Team:  Kerin Ransom, PA-C Sande Rives, [place]  Please call our office 2 months in advance to schedule this appointment   At [facility], you and your health needs are our  priority.  As part of our continuing mission to provide you with exceptional heart care, we have created designated Provider Care Teams.  These Care Teams include your primary Cardiologist (physician) and Advanced Practice Providers (APPs -  Physician Assistants and Nurse Practitioners) who all work together to provide you with the care you need, when you need it.

## 2020-06-24 ENCOUNTER — Ambulatory Visit (HOSPITAL_COMMUNITY): Payer: Medicare Other | Attending: Cardiology

## 2020-06-24 ENCOUNTER — Other Ambulatory Visit: Payer: Self-pay

## 2020-06-24 DIAGNOSIS — I350 Nonrheumatic aortic (valve) stenosis: Secondary | ICD-10-CM | POA: Diagnosis not present

## 2020-06-24 DIAGNOSIS — R011 Cardiac murmur, unspecified: Secondary | ICD-10-CM

## 2020-06-24 DIAGNOSIS — R0602 Shortness of breath: Secondary | ICD-10-CM | POA: Diagnosis not present

## 2020-06-24 LAB — ECHOCARDIOGRAM COMPLETE
AR max vel: 1.43 cm2
AV Area VTI: 1.39 cm2
AV Area mean vel: 1.44 cm2
AV Mean grad: 12 mmHg
AV Peak grad: 23.7 mmHg
Ao pk vel: 2.43 m/s
Area-P 1/2: 3.02 cm2
S' Lateral: 3 cm

## 2020-07-05 DIAGNOSIS — I10 Essential (primary) hypertension: Secondary | ICD-10-CM | POA: Diagnosis not present

## 2020-07-05 DIAGNOSIS — J449 Chronic obstructive pulmonary disease, unspecified: Secondary | ICD-10-CM | POA: Diagnosis not present

## 2020-07-05 DIAGNOSIS — K219 Gastro-esophageal reflux disease without esophagitis: Secondary | ICD-10-CM | POA: Diagnosis not present

## 2020-07-30 DIAGNOSIS — I10 Essential (primary) hypertension: Secondary | ICD-10-CM | POA: Diagnosis not present

## 2020-07-30 DIAGNOSIS — J449 Chronic obstructive pulmonary disease, unspecified: Secondary | ICD-10-CM | POA: Diagnosis not present

## 2020-07-30 DIAGNOSIS — K219 Gastro-esophageal reflux disease without esophagitis: Secondary | ICD-10-CM | POA: Diagnosis not present

## 2020-08-24 DIAGNOSIS — Z96652 Presence of left artificial knee joint: Secondary | ICD-10-CM | POA: Diagnosis not present

## 2020-08-24 DIAGNOSIS — M19041 Primary osteoarthritis, right hand: Secondary | ICD-10-CM | POA: Diagnosis not present

## 2020-08-24 DIAGNOSIS — M25562 Pain in left knee: Secondary | ICD-10-CM | POA: Diagnosis not present

## 2020-09-28 DIAGNOSIS — J449 Chronic obstructive pulmonary disease, unspecified: Secondary | ICD-10-CM | POA: Diagnosis not present

## 2020-09-28 DIAGNOSIS — K219 Gastro-esophageal reflux disease without esophagitis: Secondary | ICD-10-CM | POA: Diagnosis not present

## 2020-09-28 DIAGNOSIS — I10 Essential (primary) hypertension: Secondary | ICD-10-CM | POA: Diagnosis not present

## 2020-10-24 IMAGING — US US ABDOMEN COMPLETE
1 series · 13 of 25 positions shown · non-contrast
Comparison: [DATE] [DATE], [DATE].  [DATE] [DATE], [DATE].

CLINICAL DATA: Cystic lesion of liver.

EXAM:
ABDOMEN ULTRASOUND COMPLETE

[Series 1: us abdomen complete · 0.19mm/px · 13 of 87 slices shown]
[im 1/87]
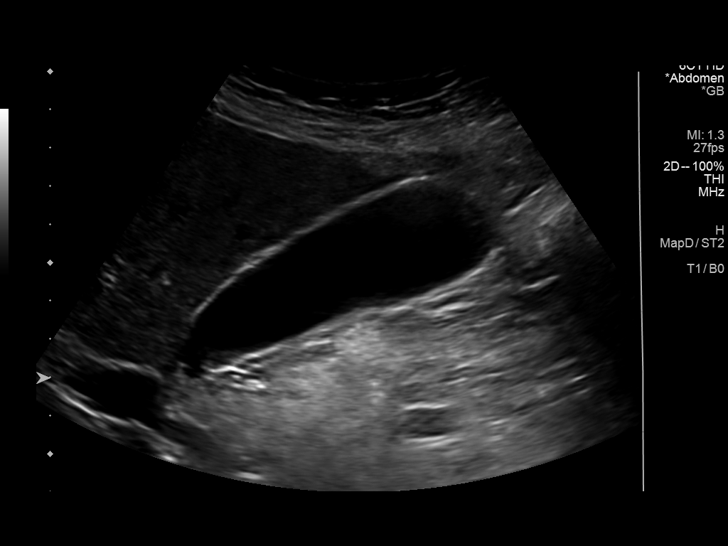
[im 8/87]
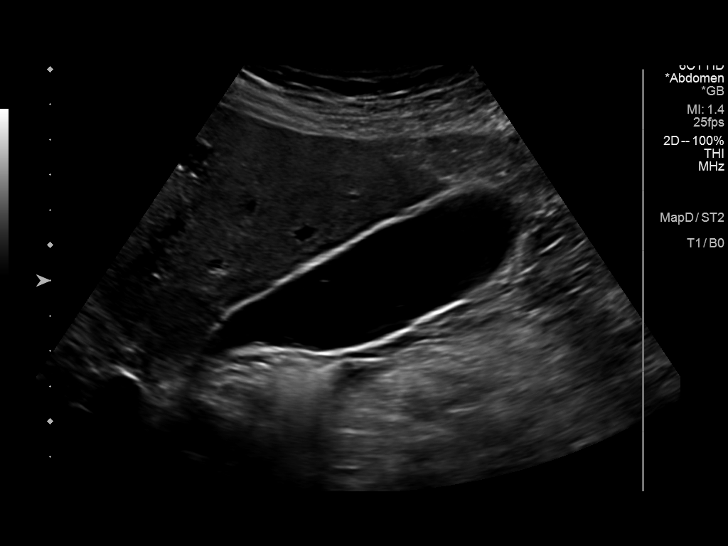
[im 15/87]
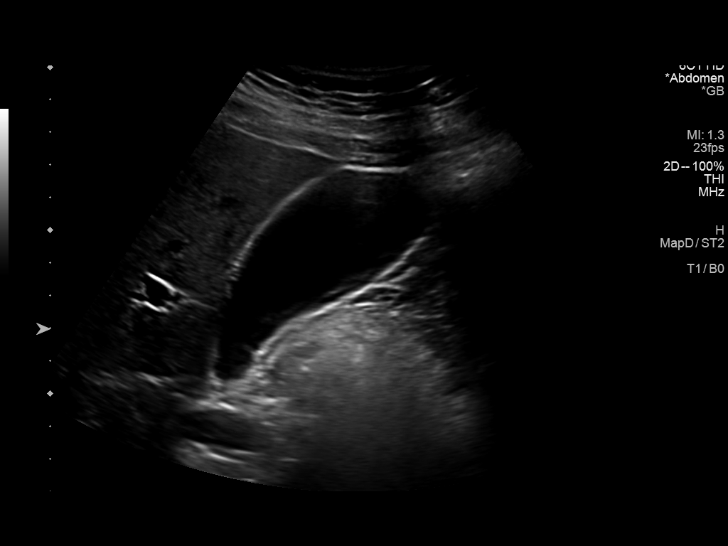
[im 22/87]
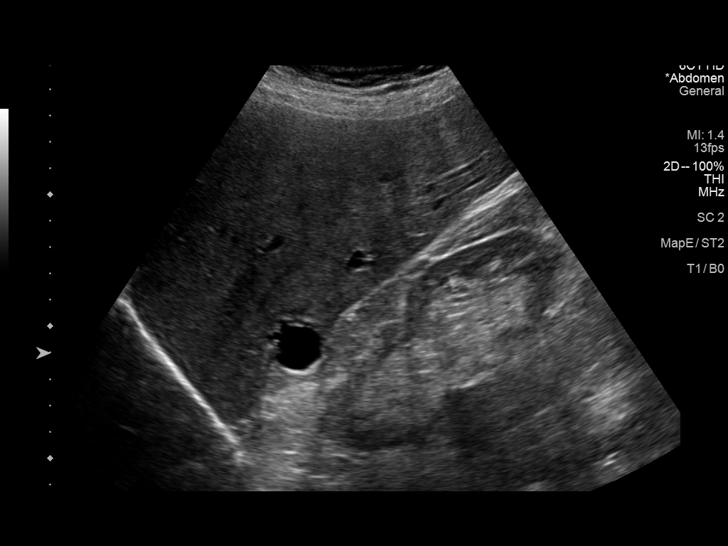
[im 29/87]
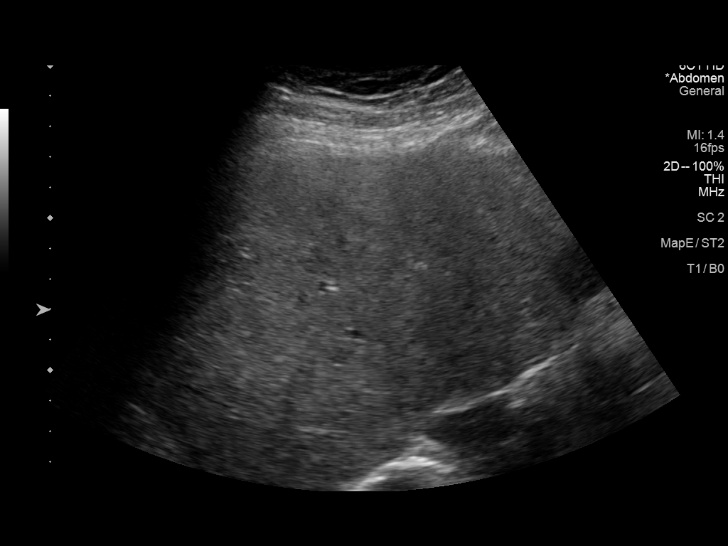
[im 36/87]
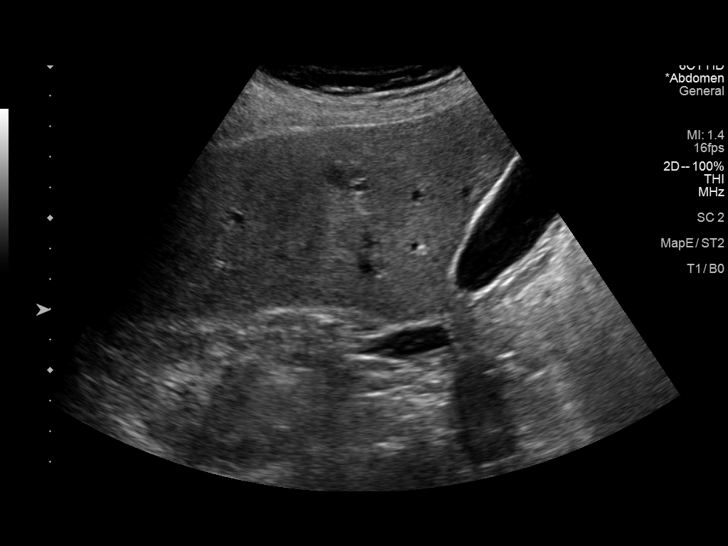
[im 44/87]
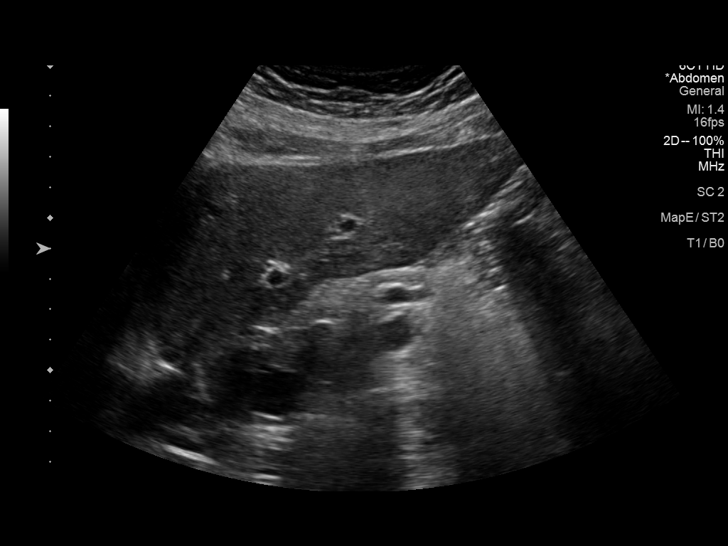
[im 51/87]
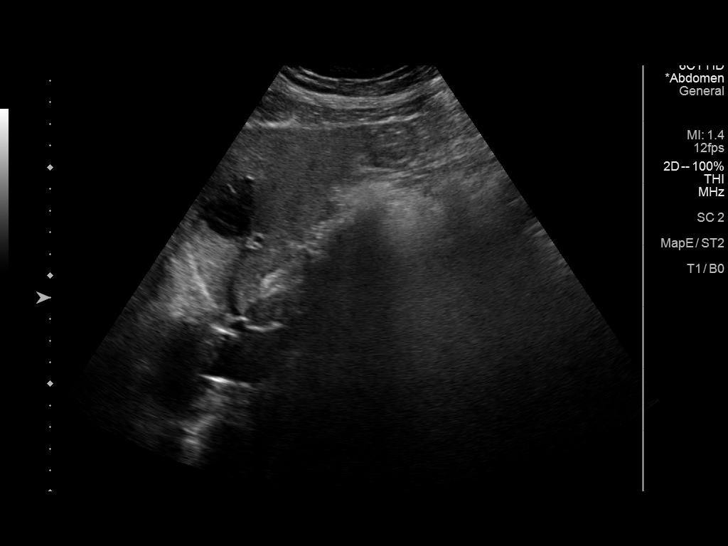
[im 58/87]
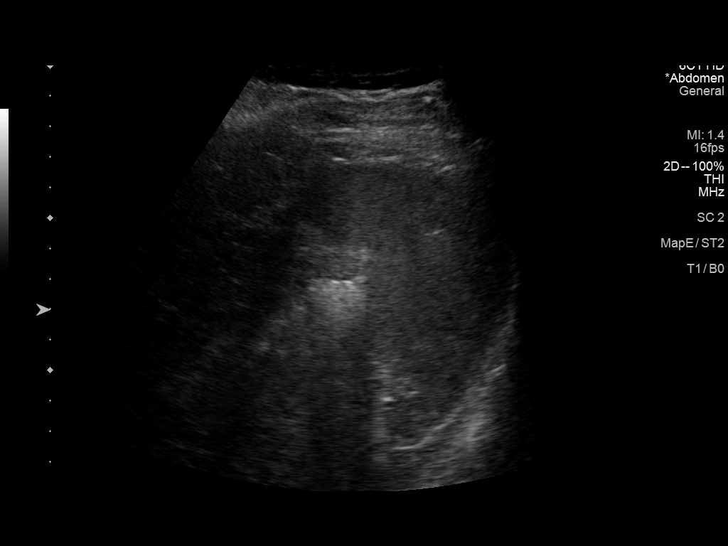
[im 65/87]
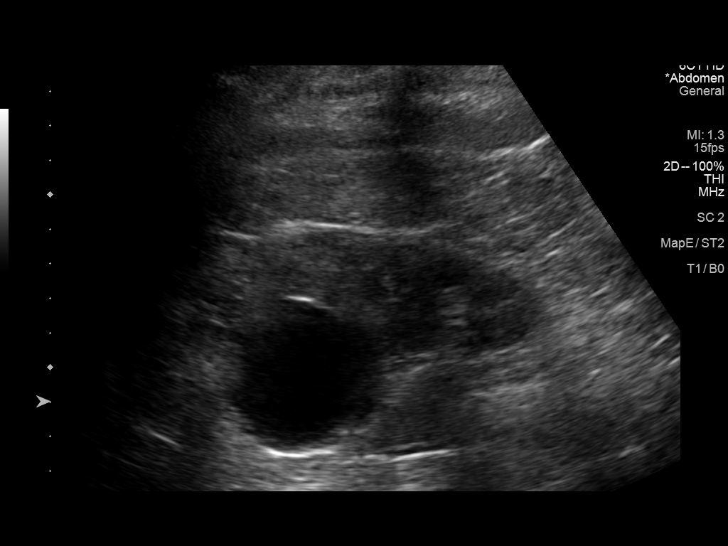
[im 72/87]
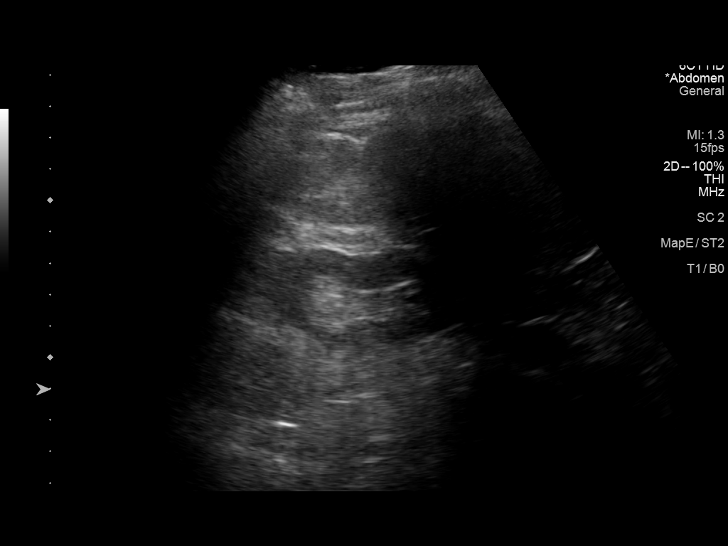
[im 79/87]
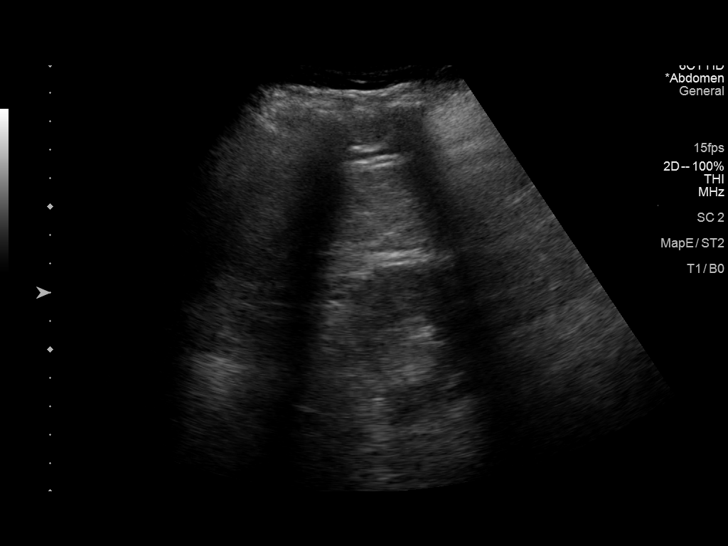
[im 87/87]
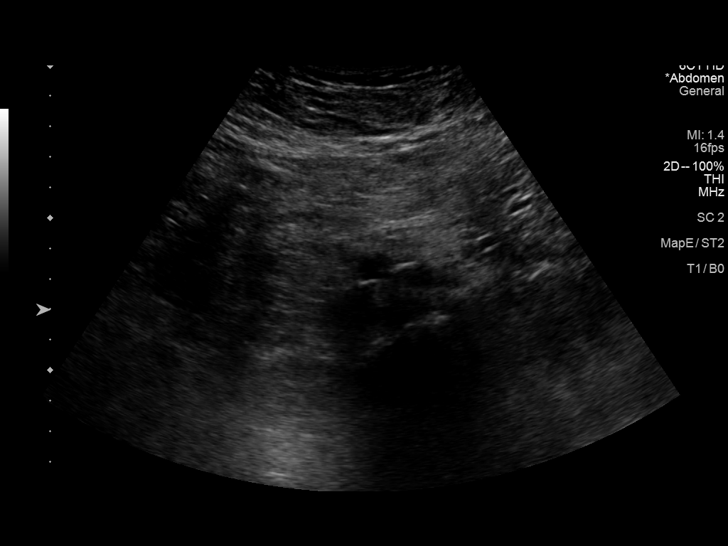

[13 of 25 positions shown; findings below may reference images not displayed]

FINDINGS: Gallbladder: No gallstones or wall thickening visualized. No
sonographic Murphy sign noted by sonographer.

Common bile duct: Diameter: 4 mm which is within normal limits.

Liver: Multiple cysts are noted throughout the liver as noted on
prior studies. The largest right hepatic cyst measures 2.8 cm which
is not significantly changed compared to prior exam. The largest
left hepatic cyst measures 3.7 cm which is not significantly
changed. Within normal limits in parenchymal echogenicity. Portal
vein is patent on color Doppler imaging with normal direction of
blood flow towards the liver.

IVC: Not visualized due to overlying bowel gas.

Pancreas: Visualized portion unremarkable.

Spleen: Size and appearance within normal limits.

Right Kidney: Length: 10.6 cm. 4.7 cm simple cyst is seen in upper
pole which is not significantly changed compared to prior exam.
Echogenicity within normal limits. No mass or hydronephrosis
visualized.

Left Kidney: Length: 12.2 cm. Echogenicity within normal limits. No
mass or hydronephrosis visualized.

Abdominal aorta: No aneurysm visualized.

Other findings: None.
IMPRESSION: Multiple simple hepatic cysts are noted which were present on prior
exam of 0233 and therefore most likely benign. Stable right renal
cyst is noted as well. No other definite abnormality seen in the
abdomen.

## 2020-11-27 DIAGNOSIS — R1032 Left lower quadrant pain: Secondary | ICD-10-CM | POA: Diagnosis not present

## 2020-12-17 DIAGNOSIS — M1711 Unilateral primary osteoarthritis, right knee: Secondary | ICD-10-CM | POA: Diagnosis not present

## 2021-01-04 DIAGNOSIS — R109 Unspecified abdominal pain: Secondary | ICD-10-CM | POA: Diagnosis not present

## 2021-01-04 DIAGNOSIS — Z23 Encounter for immunization: Secondary | ICD-10-CM | POA: Diagnosis not present

## 2021-01-07 DIAGNOSIS — M2341 Loose body in knee, right knee: Secondary | ICD-10-CM | POA: Diagnosis not present

## 2021-01-07 DIAGNOSIS — M1711 Unilateral primary osteoarthritis, right knee: Secondary | ICD-10-CM | POA: Diagnosis not present

## 2021-01-10 DIAGNOSIS — I10 Essential (primary) hypertension: Secondary | ICD-10-CM | POA: Diagnosis not present

## 2021-01-10 DIAGNOSIS — K219 Gastro-esophageal reflux disease without esophagitis: Secondary | ICD-10-CM | POA: Diagnosis not present

## 2021-01-10 DIAGNOSIS — J449 Chronic obstructive pulmonary disease, unspecified: Secondary | ICD-10-CM | POA: Diagnosis not present

## 2021-01-23 ENCOUNTER — Other Ambulatory Visit: Payer: Self-pay | Admitting: Physician Assistant

## 2021-01-25 DIAGNOSIS — R109 Unspecified abdominal pain: Secondary | ICD-10-CM | POA: Diagnosis not present

## 2021-02-06 DIAGNOSIS — W010XXA Fall on same level from slipping, tripping and stumbling without subsequent striking against object, initial encounter: Secondary | ICD-10-CM | POA: Diagnosis not present

## 2021-02-06 DIAGNOSIS — Z7982 Long term (current) use of aspirin: Secondary | ICD-10-CM | POA: Diagnosis not present

## 2021-02-06 DIAGNOSIS — I10 Essential (primary) hypertension: Secondary | ICD-10-CM | POA: Diagnosis not present

## 2021-02-06 DIAGNOSIS — S2241XA Multiple fractures of ribs, right side, initial encounter for closed fracture: Secondary | ICD-10-CM | POA: Diagnosis not present

## 2021-02-06 DIAGNOSIS — S161XXA Strain of muscle, fascia and tendon at neck level, initial encounter: Secondary | ICD-10-CM | POA: Diagnosis not present

## 2021-02-06 DIAGNOSIS — R918 Other nonspecific abnormal finding of lung field: Secondary | ICD-10-CM | POA: Diagnosis not present

## 2021-02-06 DIAGNOSIS — S199XXA Unspecified injury of neck, initial encounter: Secondary | ICD-10-CM | POA: Diagnosis not present

## 2021-02-06 DIAGNOSIS — S3991XA Unspecified injury of abdomen, initial encounter: Secondary | ICD-10-CM | POA: Diagnosis not present

## 2021-02-06 DIAGNOSIS — Y9301 Activity, walking, marching and hiking: Secondary | ICD-10-CM | POA: Diagnosis not present

## 2021-02-06 DIAGNOSIS — S3993XA Unspecified injury of pelvis, initial encounter: Secondary | ICD-10-CM | POA: Diagnosis not present

## 2021-02-06 DIAGNOSIS — S2242XA Multiple fractures of ribs, left side, initial encounter for closed fracture: Secondary | ICD-10-CM | POA: Diagnosis not present

## 2021-02-06 DIAGNOSIS — Y92009 Unspecified place in unspecified non-institutional (private) residence as the place of occurrence of the external cause: Secondary | ICD-10-CM | POA: Diagnosis not present

## 2021-02-08 DIAGNOSIS — W19XXXD Unspecified fall, subsequent encounter: Secondary | ICD-10-CM | POA: Diagnosis not present

## 2021-02-08 DIAGNOSIS — I700 Atherosclerosis of aorta: Secondary | ICD-10-CM | POA: Diagnosis not present

## 2021-02-08 DIAGNOSIS — S2242XD Multiple fractures of ribs, left side, subsequent encounter for fracture with routine healing: Secondary | ICD-10-CM | POA: Diagnosis not present

## 2021-02-08 DIAGNOSIS — R918 Other nonspecific abnormal finding of lung field: Secondary | ICD-10-CM | POA: Diagnosis not present

## 2021-02-24 ENCOUNTER — Encounter: Payer: Self-pay | Admitting: Gastroenterology

## 2021-02-24 ENCOUNTER — Ambulatory Visit: Payer: Medicare Other | Admitting: Gastroenterology

## 2021-02-24 VITALS — BP 160/78 | HR 70 | Ht 71.0 in | Wt 193.0 lb

## 2021-02-24 DIAGNOSIS — R1013 Epigastric pain: Secondary | ICD-10-CM

## 2021-02-24 DIAGNOSIS — K589 Irritable bowel syndrome without diarrhea: Secondary | ICD-10-CM

## 2021-02-24 NOTE — Progress Notes (Signed)
    History of Present Illness: This is an 80 year old male with epigastric pain.  He is accompanied by his wife.  He was taking high dose ibuprofen 2 months ago and he developed epigastric pain.  He discontinued ibuprofen and his pain completely resolved.  He recently broke 5 ribs.  His IBS is under good control.  Reflux symptoms are under good control.  Current Medications, Allergies, Past Medical History, Past Surgical History, Family History and Social History were reviewed in [employer] record.   Physical Exam: General: Well developed, well nourished, no acute distress Head: Normocephalic and atraumatic Eyes: Sclerae anicteric, EOMI Ears: Normal auditory acuity Mouth: Not examined, mask on during Covid-19 pandemic Lungs: Clear throughout to auscultation Heart: Regular rate and rhythm; no murmurs, rubs or bruits Abdomen: Soft, non tender and non distended. No masses, hepatosplenomegaly or hernias noted. Normal Bowel sounds Rectal: Not done Musculoskeletal: Symmetrical with no gross deformities  Pulses:  Normal pulses noted Extremities: No clubbing, cyanosis, edema or deformities noted Neurological: Alert oriented x 4, grossly nonfocal Psychological:  Alert and cooperative. Normal mood and affect   Assessment and Recommendations:  Epigastric pain, resolved.  Likely NSAID induced gastritis or ulcer.  Avoid NSAIDs long-term.  Aspirin 81 mg daily is okay if recommended by his physician. Barrett's esophagus. Continue pantoprazole 40 mg po qd. follow antireflux measures.  Surveillance endoscopies have been discontinued due to age. IBS. Severe diverticulosis. Continue glycopyrrolate 2 mg po bid. Librax tid prn.  Personal history of adenomatous colon polyps.  Surveillance colonoscopies have been discontinued due to age.

## 2021-02-24 NOTE — Patient Instructions (Addendum)
Please avoid NSAIDS, you may continue your Celebrex as needed.    Please follow up with Dr. Fuller Plan in one year.   The [facility] providers would like to encourage you to use [facility] to communicate with providers for non-urgent requests or questions.  Due to long hold times on the telephone, sending your provider a message by [facility] may be a faster and more efficient way to get a response.  Please allow 48 business hours for a response.  Please remember that this is for non-urgent requests.    Due to recent changes in healthcare laws, you may see the results of your imaging and laboratory studies on MyChart before your provider has had a chance to review them.  We understand that in some cases there may be results that are confusing or concerning to you. Not all laboratory results come back in the same time frame and the provider may be waiting for multiple results in order to interpret others.  Please give [place] 48 hours in order for your provider to thoroughly review all the results before contacting the office for clarification of your results.    If you are age 46 or older, your body mass index should be between 23-30. Your Body mass index is 26.92 kg/m. If this is out of the aforementioned range listed, please consider follow up with your Primary Care Provider.  If you are age 74 or younger, your body mass index should be between 19-25. Your Body mass index is 26.92 kg/m. If this is out of the aformentioned range listed, please consider follow up with your Primary Care Provider.   ________________________________________________________  The [place] [facility] providers would like to encourage you to use [facility] to communicate with providers for non-urgent requests or questions.  Due to long hold times on the telephone, sending your provider a message by [facility] may be a faster and more efficient way to get a response.  Please allow 48 business hours for a response.  Please remember that this is for  non-urgent requests.  _______________________________________________________ Thank you for choosing me and [facility].  Pricilla Riffle. Dagoberto Ligas., MD., Marval Regal

## 2021-03-01 DIAGNOSIS — S2242XD Multiple fractures of ribs, left side, subsequent encounter for fracture with routine healing: Secondary | ICD-10-CM | POA: Diagnosis not present

## 2021-03-01 DIAGNOSIS — W19XXXD Unspecified fall, subsequent encounter: Secondary | ICD-10-CM | POA: Diagnosis not present

## 2021-03-18 DIAGNOSIS — H524 Presbyopia: Secondary | ICD-10-CM | POA: Diagnosis not present

## 2021-03-18 DIAGNOSIS — H353131 Nonexudative age-related macular degeneration, bilateral, early dry stage: Secondary | ICD-10-CM | POA: Diagnosis not present

## 2021-04-21 DIAGNOSIS — M79671 Pain in right foot: Secondary | ICD-10-CM | POA: Diagnosis not present

## 2021-04-26 DIAGNOSIS — K589 Irritable bowel syndrome without diarrhea: Secondary | ICD-10-CM | POA: Diagnosis not present

## 2021-04-26 DIAGNOSIS — H6690 Otitis media, unspecified, unspecified ear: Secondary | ICD-10-CM | POA: Diagnosis not present

## 2021-04-29 ENCOUNTER — Other Ambulatory Visit: Payer: Self-pay | Admitting: Gastroenterology

## 2021-07-05 ENCOUNTER — Other Ambulatory Visit: Payer: Self-pay | Admitting: Physician Assistant

## 2021-07-12 DIAGNOSIS — I700 Atherosclerosis of aorta: Secondary | ICD-10-CM | POA: Diagnosis not present

## 2021-07-12 DIAGNOSIS — I10 Essential (primary) hypertension: Secondary | ICD-10-CM | POA: Diagnosis not present

## 2021-07-12 DIAGNOSIS — R531 Weakness: Secondary | ICD-10-CM | POA: Diagnosis not present

## 2021-07-12 DIAGNOSIS — J449 Chronic obstructive pulmonary disease, unspecified: Secondary | ICD-10-CM | POA: Diagnosis not present

## 2021-07-14 NOTE — Progress Notes (Signed)
? ?Cardiology Clinic Note  ? ?Patient Name: Samuel [name] ?Date of Encounter: 07/19/2021 ? ?Primary Care Provider:  Orpah Melter, MD ?Primary Cardiologist:  Kirk Ruths, MD ? ?Patient Profile  ?  ?ULICE FOLLETT 81 year old male presents the clinic today for follow-up evaluation of his hypertension and chest discomfort. ? ?Past Medical History  ?  ?Past Medical History:  ?Diagnosis Date  ? Arthritis   ? Diverticulosis   ? GERD (gastroesophageal reflux disease)   ? Heart murmur   ? Hemorrhoids   ? Hiatal hernia   ? Hypertension   ? Tubular adenoma of colon 01/2009  ? ?Past Surgical History:  ?Procedure Laterality Date  ? COLONOSCOPY    ? HERNIA REPAIR    ? POLYPECTOMY    ? REPLACEMENT TOTAL KNEE  02/14/11  ? left knee  ? ? ?Allergies ? ?Allergies  ?Allergen Reactions  ? Codeine   ?  REACTION: Agitation  ? ? ?History of Present Illness  ?  ?[name] has a PMH of hypertension, diverticulosis, GERD, Barrett's esophagus, and chest discomfort.  His PMH also includescardiac murmur, dyspnea, and lung nodule. ?  ?He was initially referred by his PCP Christella Noa.  He was seen in initial evaluation 7/17.  His echocardiogram 6/17 showed normal LV function, severe left atrial enlargement, and mild right atrial enlargement.  A nuclear stress test 7/17 showed an EF of 57%, no ischemia and no infarction.  An abdominal CT 10/19 showed aortic atherosclerosis but no aneurysm.  A 6 mm nodule in the right upper lobe and follow-up imaging was recommended for 6-12 months.  He was last seen by Dr. Stanford Breed on 05/07/2019.  During that time he denied orthopnea, PND, lower extremity swelling, exertional chest pain, and syncope. ?  ?His follow-up CT showed a 4 mm right lower lobe nodule unchanged from previous scan.  The nodule was felt to be benign. ?  ?He presented the clinic 06/04/2020 for follow-up evaluation stated he occasionally noticed chest discomfort in the evening after eating a big meal.  He reported he presented to his PCP  who prescribed Pepcid along with his Protonix.  He felt that it made some difference.  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  He also reported that he was able to walk up and down 2 flights of stairs at church.  He denied chest discomfort with the activities.    He reported that he regularly drank beet juice which had been helping with his blood pressure at home.  It had been ranging in the 120s-130s over 70s.  I ordered a echocardiogram for further evaluation, continued his current medications, requested his lab work from his PCP, gave salty 6 diet sheet, and planned follow-up in a year.  His echocardiogram 06/24/2020 showed an LVEF of 60-65% moderate LVH and G2 DD. ? ?He presents to the clinic today for follow-up evaluation states he has noticed leg weakness for the last 4-5 months.  He reports that he first noted leg weakness 07/22/2019 after receiving the [place] shot.  His symptoms progressively improved.  He felt he had returned to normal and was doing all of his normal activities and work around his property.  However, over the last 4-5 months he has noticed consistent weakness.  He reports that he notices weakness even when he wakes up in the morning.  He denies claudication.  He feels that  his breathing is not quite as good as it was.  He had extensive lab work drawn at his PCP which I will request for review.  We will order an echocardiogram, have him increase his physical activity as tolerated, continue his diet and follow-up after echocardiogram. ?  ?Today he denies chest pain, shortness of breath, lower extremity edema, fatigue, palpitations, melena, hematuria, hemoptysis, diaphoresis, weakness, presyncope, syncope, orthopnea, and PND. ?Home Medications  ?  ?Prior to Admission medications   ?Medication Sig Start Date End Date Taking? Authorizing Provider  ?amLODipine (NORVASC) 5 MG tablet Take 5 mg by mouth daily.     [provider]  ?aspirin 81 MG tablet Take 81 mg by mouth daily.    [provider]  ?Cholecalciferol (VITAMIN D3) 2000 UNITS TABS Take 1 tablet by mouth daily.     [provider]  ?clidinium-chlordiazePOXIDE (LIBRAX) 5-2.5 MG capsule Take 1 capsule by mouth 3 (three) times daily as needed. 04/29/21   Ladene Artist, MD  ?glycopyrrolate (ROBINUL) 2 MG tablet TAKE ONE TABLET BY MOUTH TWICE DAILY 01/25/21   Esterwood, Amy S, PA-C  ?hydrochlorothiazide (HYDRODIURIL) 25 MG tablet Take 25 mg by mouth daily.    [provider]  ?pantoprazole (PROTONIX) 40 MG tablet TAKE ONE TABLET BY MOUTH TWICE DAILY 07/05/21   Ladene Artist, MD  ?Turmeric 500 MG TABS Take 1 tablet by mouth daily.    [provider]  ?vitamin E 180 MG (400 UNITS) capsule Take 400 Units by mouth daily.    [provider]  ?Zinc 50 MG TABS Take 1 tablet by mouth daily.    [provider]  ? ? ?Family History  ?  ?Family History  ?Problem Relation Age of Onset  ? Breast cancer Sister   ? Pancreatic cancer Brother   ? Colon cancer Neg Hx   ? Esophageal cancer Neg Hx   ? Rectal cancer Neg Hx   ? Stomach cancer Neg Hx   ? ?He indicated that his mother is deceased. He indicated that his father is deceased. He indicated that his sister is deceased. He indicated that his brother is deceased. He indicated that the status of his neg hx is unknown. ? ?Social History  ?  ?Social History  ? ?Socioeconomic History  ? Marital status: Married  ?  Spouse name: Not on file  ? Number of children: 4  ? Years of education: Not on file  ? Highest education level: Not on file  ?Occupational History  ? Not on file  ?Tobacco Use  ? Smoking status: Former  ?  Types: Cigarettes  ?  Quit date: 02/21/1964  ?  Years since quitting: 57.4  ? Smokeless tobacco: Never  ?Vaping Use  ? Vaping Use: Never used  ?Substance and Sexual Activity  ? Alcohol use: No  ? Drug use: No  ? Sexual activity: Not on file  ?Other Topics  Concern  ? Not on file  ?Social History Narrative  ? Not on file  ? ?Social Determinants of Health  ? ?Financial Resource Strain: Not on file  ?Food Insecurity: Not on file  ?Transportation Needs: Not on file  ?Physical Activity: Not on file  ?Stress: Not on file  ?Social Connections: Not on file  ?Intimate Partner Violence: Not on file  ?  ? ?Review of Systems  ?  ?General:  No chills, fever, night sweats or weight changes.  ?Cardiovascular:  No chest pain, dyspnea on exertion, edema, orthopnea, palpitations, paroxysmal  nocturnal dyspnea. ?Dermatological: No rash, lesions/masses ?Respiratory: No cough, dyspnea ?Urologic: No hematuria, dysuria ?Abdominal:   No nausea, vomiting, diarrhea, bright red blood per rectum, melena, or hematemesis ?Neurologic:  No visual changes, wkns, changes in mental status. ?All other systems reviewed and are otherwise negative except as noted above. ? ?Physical Exam  ?  ?VS:  BP 132/68   Pulse 87   Ht '5\' 10"'$  (1.778 m)   Wt 194 lb 3.2 oz (88.1 kg)   SpO2 96%   BMI 27.86 kg/m?  , BMI Body mass index is 27.86 kg/m?. ?GEN: Well nourished, well developed, in no acute distress. ?HEENT: normal. ?Neck: Supple, no JVD, carotid bruits, or masses. ?Cardiac: RRR, 2/6 systolic murmur heard best along left sternal border, rubs, or gallops. No clubbing, cyanosis, edema.  Radials/DP/PT 2+ and equal bilaterally.  ?Respiratory:  Respirations regular and unlabored, clear to auscultation bilaterally. ?GI: Soft, nontender, nondistended, BS + x 4. ?MS: no deformity or atrophy. ?Skin: warm and dry, no rash. ?Neuro:  Strength and sensation are intact. ?Psych: Normal affect. ? ?Accessory Clinical Findings  ?  ?Recent Labs: ?No results found for requested labs within last 8760 hours.  ? ?Recent Lipid Panel ?No results found for: CHOL, TRIG, HDL, CHOLHDL, VLDL, LDLCALC, LDLDIRECT ? ?ECG personally reviewed by me today-sinus rhythm with first-degree AV block with frequent and consecutive premature ventricular  complexes 87 bpm- No acute changes ? ?EKG 06/04/2020 ?sinus rhythm with marked sinus arrhythmia with first-degree block 72 bpm- No acute changes ?  ?Chest CT without contrast 05/15/2019 ?COMPARISON:  CT abdomen pelvis

## 2021-07-19 ENCOUNTER — Encounter: Payer: Self-pay | Admitting: General Practice

## 2021-07-19 ENCOUNTER — Ambulatory Visit: Payer: Medicare Other | Admitting: General Practice

## 2021-07-19 VITALS — BP 132/68 | HR 87 | Ht 70.0 in | Wt 194.2 lb

## 2021-07-19 DIAGNOSIS — R5383 Other fatigue: Secondary | ICD-10-CM

## 2021-07-19 DIAGNOSIS — R011 Cardiac murmur, unspecified: Secondary | ICD-10-CM | POA: Diagnosis not present

## 2021-07-19 DIAGNOSIS — R29898 Other symptoms and signs involving the musculoskeletal system: Secondary | ICD-10-CM

## 2021-07-19 DIAGNOSIS — I10 Essential (primary) hypertension: Secondary | ICD-10-CM | POA: Diagnosis not present

## 2021-07-19 DIAGNOSIS — R0602 Shortness of breath: Secondary | ICD-10-CM

## 2021-07-19 NOTE — Patient Instructions (Signed)
Medication Instructions:  ?The current medical regimen is effective;  continue present plan and medications as directed. Please refer to the Current Medication list given to you today.  ?*If you need a refill on your cardiac medications before your next appointment, please call your pharmacy* ? ?Lab Work: ?NONE ?If you have labs (blood work) drawn today and your tests are completely normal, you will receive your results only by:  [place] (if you have MyChart) OR A paper copy in the mail.  If you have any lab test that is abnormal or we need to change your treatment, we will call you to review the results. You may go to any [facility] that is convenient for you however, we do have a lab in our office that is able to assist you. You DO NOT need an appointment for our lab. The lab is open 8:00am and closes at 4:00pm. Lunch 12:45 - 1:45pm. ? ?Testing/Procedures: ?Echocardiogram - Your physician has requested that you have an echocardiogram. Echocardiography is a painless test that uses sound waves to create images of your heart. It provides your doctor with information about the size and shape of your heart and how well your heart?s chambers and valves are working. This procedure takes approximately one hour. There are no restrictions for this procedure. This will be performed at either our [facility] location - [address] [place] location [facility] 2nd floor. ? ?Special Instructions ?PLEASE INCREASE PHYSICAL ACTIVITY AS TOLERATED ? ?MAINTAIN DIET ? ?Follow-Up: ?Your next appointment:  ABOUT 1-2 MONTHS AFTER ECHO  In Person with Kirk Ruths, MD  or Coletta Memos, FNP   ? ?At [facility], you and your health needs are our priority.  As part of our continuing mission to provide you with exceptional heart care, we have created designated Provider Care Teams.  These Care Teams include your primary Cardiologist (physician) and Advanced Practice Providers (APPs -  Physician  Assistants and Nurse Practitioners) who all work together to provide you with the care you need, when you need it. ? ? ? ? ?

## 2021-07-20 DIAGNOSIS — R1032 Left lower quadrant pain: Secondary | ICD-10-CM | POA: Diagnosis not present

## 2021-07-26 ENCOUNTER — Telehealth: Payer: Self-pay | Admitting: Gastroenterology

## 2021-07-26 ENCOUNTER — Other Ambulatory Visit (INDEPENDENT_AMBULATORY_CARE_PROVIDER_SITE_OTHER): Payer: Medicare Other

## 2021-07-26 ENCOUNTER — Other Ambulatory Visit: Payer: Self-pay

## 2021-07-26 DIAGNOSIS — R110 Nausea: Secondary | ICD-10-CM

## 2021-07-26 DIAGNOSIS — K5790 Diverticulosis of intestine, part unspecified, without perforation or abscess without bleeding: Secondary | ICD-10-CM

## 2021-07-26 DIAGNOSIS — R1013 Epigastric pain: Secondary | ICD-10-CM | POA: Diagnosis not present

## 2021-07-26 DIAGNOSIS — K589 Irritable bowel syndrome without diarrhea: Secondary | ICD-10-CM | POA: Diagnosis not present

## 2021-07-26 LAB — COMPREHENSIVE METABOLIC PANEL
ALT: 14 U/L (ref 0–53)
AST: 17 U/L (ref 0–37)
Albumin: 4.2 g/dL (ref 3.5–5.2)
Alkaline Phosphatase: 69 U/L (ref 39–117)
BUN: 25 mg/dL — ABNORMAL HIGH (ref 6–23)
CO2: 26 mEq/L (ref 19–32)
Calcium: 9.3 mg/dL (ref 8.4–10.5)
Chloride: 104 mEq/L (ref 96–112)
Creatinine, Ser: 0.97 mg/dL (ref 0.40–1.50)
GFR: 72.44 mL/min (ref >60.00)
Glucose, Bld: 90 mg/dL (ref 70–99)
Potassium: 3.8 mEq/L (ref 3.5–5.1)
Sodium: 140 mEq/L (ref 135–145)
Total Bilirubin: 0.4 mg/dL (ref 0.2–1.2)
Total Protein: 7.2 g/dL (ref 6.0–8.3)

## 2021-07-26 LAB — CBC WITH DIFFERENTIAL/PLATELET
Basophils Absolute: 0.1 10*3/uL (ref 0.0–0.1)
Basophils Relative: 0.7 % (ref 0.0–3.0)
Eosinophils Absolute: 0.1 10*3/uL (ref 0.0–0.7)
Eosinophils Relative: 1.1 % (ref 0.0–5.0)
HCT: 38 % — ABNORMAL LOW (ref 39.0–52.0)
Hemoglobin: 12.7 g/dL — ABNORMAL LOW (ref 13.0–17.0)
Lymphocytes Relative: 31.1 % (ref 12.0–46.0)
Lymphs Abs: 2.3 10*3/uL (ref 0.7–4.0)
MCHC: 33.5 g/dL (ref 30.0–36.0)
MCV: 88.3 fl (ref 78.0–100.0)
Monocytes Absolute: 0.6 10*3/uL (ref 0.1–1.0)
Monocytes Relative: 7.4 % (ref 3.0–12.0)
Neutro Abs: 4.4 10*3/uL (ref 1.4–7.7)
Neutrophils Relative %: 59.7 % (ref 43.0–77.0)
Platelets: 215 10*3/uL (ref 150.0–400.0)
RBC: 4.3 Mil/uL (ref 4.22–5.81)
RDW: 13.6 % (ref 11.5–15.5)
WBC: 7.5 10*3/uL (ref 4.0–10.5)

## 2021-07-26 LAB — C-REACTIVE PROTEIN: CRP: <1 mg/dL (ref 0.5–20.0)

## 2021-07-26 NOTE — Telephone Encounter (Signed)
Prev colon 2018 reviewed. ?Significant diverticular disease. ? ?Patient with lower abdominal pain, treated empirically with Augmentin x 10 days without any benefit. ? ?Plan: ?-Check CBC with diff, CMP, CRP ?-CT Abdo/pelvis with p.o. and IV contrast ?-I am glad he has follow-up appointment with Anderson Malta 08/11/2021.  Depending upon the above CT, may move it earlier ?-Low fiber diet currently ? ? ?RG ?

## 2021-07-26 NOTE — Telephone Encounter (Signed)
Patient called in with complaints of mid upper abdominal pain & nausea that started a little over a week ago. He was seen by his PCP on 4/4 and was prescribed a 10 day course of Augmentin due to his history of diverticulosis. He says it has been almost a week and he is still having constant pain (scale 5/10), and would like further recommendations since his follow up with Anderson Malta, [place] is not until 4/26. Will route to MD.  ?

## 2021-07-26 NOTE — Telephone Encounter (Signed)
Spoke with patient regarding Dr. Steve Rattler recommendations. CT & labs ordered, schedulers notified as well. Patient is aware to come in at earliest convenience to have labs collected, and number provided for scheduling if he has not heard from them. Patient has also been sent this through [facility] as requested by patient. ?

## 2021-07-26 NOTE — Telephone Encounter (Signed)
Patient called complaining of lower abdominal pain and nausea.  On a scale of 1-10, he says he's at about a 6.  He went to his PCP who started him on a 10-day course of augmentin.  He's on Day 7 and sees no improvement. He feels like he may need some testing.  He's scheduled with Ellouise Newer on 4/26 at 10 a.m., but feels like he needs to be seen sooner rather than later or at least told what he should do in the interim.  Please call patient and advise.  Thank you. ?

## 2021-07-30 DIAGNOSIS — R058 Other specified cough: Secondary | ICD-10-CM | POA: Diagnosis not present

## 2021-08-03 ENCOUNTER — Ambulatory Visit (HOSPITAL_COMMUNITY): Payer: Medicare Other | Attending: Cardiology

## 2021-08-03 DIAGNOSIS — R29898 Other symptoms and signs involving the musculoskeletal system: Secondary | ICD-10-CM | POA: Diagnosis not present

## 2021-08-03 DIAGNOSIS — R0602 Shortness of breath: Secondary | ICD-10-CM | POA: Diagnosis not present

## 2021-08-03 DIAGNOSIS — R5383 Other fatigue: Secondary | ICD-10-CM | POA: Diagnosis not present

## 2021-08-03 LAB — ECHOCARDIOGRAM COMPLETE
AR max vel: 1.83 cm2
AV Area VTI: 1.82 cm2
AV Area mean vel: 1.82 cm2
AV Mean grad: 14.5 mmHg
AV Peak grad: 29.5 mmHg
Ao pk vel: 2.72 m/s
Area-P 1/2: 3.17 cm2
S' Lateral: 2.7 cm

## 2021-08-04 ENCOUNTER — Other Ambulatory Visit: Payer: Self-pay

## 2021-08-04 DIAGNOSIS — R29898 Other symptoms and signs involving the musculoskeletal system: Secondary | ICD-10-CM

## 2021-08-04 DIAGNOSIS — R5383 Other fatigue: Secondary | ICD-10-CM

## 2021-08-04 DIAGNOSIS — R0602 Shortness of breath: Secondary | ICD-10-CM

## 2021-08-05 ENCOUNTER — Telehealth: Payer: Self-pay | Admitting: General Practice

## 2021-08-05 ENCOUNTER — Ambulatory Visit (HOSPITAL_COMMUNITY)
Admission: RE | Admit: 2021-08-05 | Discharge: 2021-08-05 | Disposition: A | Discharge status: Home / Self Care | Payer: Medicare Other | Source: Ambulatory Visit | Attending: Gastroenterology | Admitting: Gastroenterology

## 2021-08-05 DIAGNOSIS — R109 Unspecified abdominal pain: Secondary | ICD-10-CM | POA: Diagnosis not present

## 2021-08-05 DIAGNOSIS — R1013 Epigastric pain: Secondary | ICD-10-CM | POA: Diagnosis not present

## 2021-08-05 DIAGNOSIS — K5790 Diverticulosis of intestine, part unspecified, without perforation or abscess without bleeding: Secondary | ICD-10-CM | POA: Diagnosis not present

## 2021-08-05 DIAGNOSIS — R110 Nausea: Secondary | ICD-10-CM | POA: Diagnosis not present

## 2021-08-05 DIAGNOSIS — K589 Irritable bowel syndrome without diarrhea: Secondary | ICD-10-CM | POA: Insufficient documentation

## 2021-08-05 MED ORDER — SODIUM CHLORIDE (PF) 0.9 % IJ SOLN
INTRAMUSCULAR | Status: AC
  Filled 2021-08-05: qty 50

## 2021-08-05 MED ORDER — IOHEXOL 300 MG/ML  SOLN
100.0000 mL | Freq: Once | INTRAMUSCULAR | Status: AC | PRN
  Administered 2021-08-05: 100 mL via INTRAVENOUS

## 2021-08-05 NOTE — Telephone Encounter (Signed)
Called pt, went over echo results, given Jesse's recommendations. He thanked me for calling him. No questions or concerns voiced at this time. ?

## 2021-08-05 NOTE — Telephone Encounter (Signed)
Patient called to see if Samuel [name] had the results from his Echo yet ?

## 2021-08-11 ENCOUNTER — Ambulatory Visit: Payer: Medicare Other | Admitting: Physician Assistant

## 2021-08-12 DIAGNOSIS — K219 Gastro-esophageal reflux disease without esophagitis: Secondary | ICD-10-CM | POA: Diagnosis not present

## 2021-08-12 DIAGNOSIS — J449 Chronic obstructive pulmonary disease, unspecified: Secondary | ICD-10-CM | POA: Diagnosis not present

## 2021-08-12 DIAGNOSIS — I10 Essential (primary) hypertension: Secondary | ICD-10-CM | POA: Diagnosis not present

## 2021-09-06 DIAGNOSIS — K589 Irritable bowel syndrome without diarrhea: Secondary | ICD-10-CM | POA: Diagnosis not present

## 2021-09-06 DIAGNOSIS — Z0000 Encounter for general adult medical examination without abnormal findings: Secondary | ICD-10-CM | POA: Diagnosis not present

## 2021-09-14 NOTE — Progress Notes (Unsigned)
Cardiology Clinic Note   Patient Name: Samuel [name] Date of Encounter: 09/20/2021  Primary Care Provider:  Orpah Melter, MD Primary Cardiologist:  Kirk Ruths, MD  Patient Profile    Samuel [name] 81 year old male presents the clinic today for follow-up evaluation of his hypertension and chest discomfort.  Past Medical History    Past Medical History:  Diagnosis Date   Arthritis    Diverticulosis    GERD (gastroesophageal reflux disease)    Heart murmur    Hemorrhoids    Hiatal hernia    Hypertension    Tubular adenoma of colon 01/2009   Past Surgical History:  Procedure Laterality Date   COLONOSCOPY     HERNIA REPAIR     POLYPECTOMY     REPLACEMENT TOTAL KNEE  02/14/11   left knee    Allergies  Allergies  Allergen Reactions   Codeine     REACTION: Agitation    History of Present Illness    Samuel [name] has a PMH of hypertension, diverticulosis, GERD, Barrett's esophagus, and chest discomfort.  His PMH also includescardiac murmur, dyspnea, and lung nodule.   He was initially referred by his PCP Christella Noa.  He was seen in initial evaluation 7/17.  His echocardiogram 6/17 showed normal LV function, severe left atrial enlargement, and mild right atrial enlargement.  A nuclear stress test 7/17 showed an EF of 57%, no ischemia and no infarction.  An abdominal CT 10/19 showed aortic atherosclerosis but no aneurysm.  A 6 mm nodule in the right upper lobe and follow-up imaging was recommended for 6-12 months.  He was last seen by Dr. Stanford Breed on 05/07/2019.  During that time he denied orthopnea, PND, lower extremity swelling, exertional chest pain, and syncope.   His follow-up CT showed a 4 mm right lower lobe nodule unchanged from previous scan.  The nodule was felt to be benign.   He presented the clinic 06/04/2020 for follow-up evaluation stated he occasionally noticed chest discomfort in the evening after eating a big meal.  He reported he presented to his PCP  who prescribed Pepcid along with his Protonix.  He felt that it made some difference.  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  He also reported that he was able to walk up and down 2 flights of stairs at church.  He denied chest discomfort with the activities.    He reported that he regularly drank beet juice which had been helping with his blood pressure at home.  It had been ranging in the 120s-130s over 70s.  I ordered a echocardiogram for further evaluation, continued his current medications, requested his lab work from his PCP, gave salty 6 diet sheet, and planned follow-up in a year.  His echocardiogram 06/24/2020 showed an LVEF of 60-65% moderate LVH and G2 DD.  He presentd to the clinic 07/19/21 for follow-up evaluation stated he had noticed leg weakness for the last 4-5 months.  He reported that he first noted leg weakness 07/22/2019 after receiving the [place] shot.  His symptoms progressively improved.  He felt he had returned to normal and was doing all of his normal activities and work around his property.  However, over the last 4-5 months he has noticed consistent weakness.  He reported that he noticed weakness even when he woke up in the morning.  He denied claudication.  He felt that  his breathing was not quite as good as it was.  He had extensive lab work drawn at his PCP which I  requested for review.  I ordered an echocardiogram, asked him increase his physical activity , continue his diet and follow-up after echocardiogram.  His echocardiogram 08/03/2021 showed normal LVEF mild mitral valve regurgitation and no other significant valvular abnormalities.  He presents to the clinic today for follow-up evaluation and states his lower extremity weakness is unchanged.  He continues to be very physically active around his property.  He reports that he did subdivided his 130 acres but kept 37 acres.  He is thinking about  buying a new [employer] 0 turn mower.  He continues to make Hay, and keep beef cattle.  We reviewed his echocardiogram and he expressed understanding.  I reassured him and his wife that it is weakness and is not related to cardiac issues.  I will continue his current medication regimen, have him continue his current physical activity as tolerated, and plan follow-up in 9 to 12 months with Dr. Stanford Breed.   Today he denies chest pain, shortness of breath, lower extremity edema, fatigue, palpitations, melena, hematuria, hemoptysis, diaphoresis, weakness, presyncope, syncope, orthopnea, and PND. Home Medications    Prior to Admission medications   Medication Sig Start Date End Date Taking? Authorizing Provider  amLODipine (NORVASC) 5 MG tablet Take 5 mg by mouth daily.    [provider]  aspirin 81 MG tablet Take 81 mg by mouth daily.    [provider]  Cholecalciferol (VITAMIN D3) 2000 UNITS TABS Take 1 tablet by mouth daily.     [provider]  clidinium-chlordiazePOXIDE (LIBRAX) 5-2.5 MG capsule Take 1 capsule by mouth 3 (three) times daily as needed. 04/29/21   Ladene Artist, MD  glycopyrrolate (ROBINUL) 2 MG tablet TAKE ONE TABLET BY MOUTH TWICE DAILY 01/25/21   Esterwood, Amy S, PA-C  hydrochlorothiazide (HYDRODIURIL) 25 MG tablet Take 25 mg by mouth daily.    [provider]  pantoprazole (PROTONIX) 40 MG tablet TAKE ONE TABLET BY MOUTH TWICE DAILY 07/05/21   Ladene Artist, MD  Turmeric 500 MG TABS Take 1 tablet by mouth daily.    [provider]  vitamin E 180 MG (400 UNITS) capsule Take 400 Units by mouth daily.    [provider]  Zinc 50 MG TABS Take 1 tablet by mouth daily.    [provider]    Family History    Family History  Problem Relation Age of Onset   Breast cancer Sister    Pancreatic cancer Brother    Colon cancer Neg Hx    Esophageal cancer Neg Hx    Rectal cancer Neg Hx    Stomach cancer Neg Hx     He indicated that his mother is deceased. He indicated that his father is deceased. He indicated that his sister is deceased. He indicated that his brother is deceased. He indicated that the status of his neg hx is unknown.  Social History    Social History   Socioeconomic History   Marital status: Married    Spouse name: Not on file   Number of children: 4   Years of education: Not on file   Highest education level: Not on file  Occupational History   Not on file  Tobacco Use   Smoking status: Former    Types: Cigarettes    Quit date: 02/21/1964    Years since quitting: 10.6  Smokeless tobacco: Never  Vaping Use   Vaping Use: Never used  Substance and Sexual Activity   Alcohol use: No   Drug use: No   Sexual activity: Not on file  Other Topics Concern   Not on file  Social History Narrative   Not on file   Social Determinants of Health   Financial Resource Strain: Not on file  Food Insecurity: Not on file  Transportation Needs: Not on file  Physical Activity: Not on file  Stress: Not on file  Social Connections: Not on file  Intimate Partner Violence: Not on file     Review of Systems    General:  No chills, fever, night sweats or weight changes.  Cardiovascular:  No chest pain, dyspnea on exertion, edema, orthopnea, palpitations, paroxysmal nocturnal dyspnea. Dermatological: No rash, lesions/masses Respiratory: No cough, dyspnea Urologic: No hematuria, dysuria Abdominal:   No nausea, vomiting, diarrhea, bright red blood per rectum, melena, or hematemesis Neurologic:  No visual changes, wkns, changes in mental status. All other systems reviewed and are otherwise negative except as noted above.  Physical Exam    VS:  BP 102/60   Pulse (!) 52   Ht '5\' 11"'$  (1.803 m)   Wt 192 lb 3.2 oz (87.2 kg)   SpO2 91%   BMI 26.81 kg/m  , BMI Body mass index is 26.81 kg/m. GEN: Well nourished, well developed, in no acute distress. HEENT: normal. Neck: Supple, no  JVD, carotid bruits, or masses. Cardiac: RRR, 2/6 systolic murmur heard best along left sternal border, rubs, or gallops. No clubbing, cyanosis, edema.  Radials/DP/PT 2+ and equal bilaterally.  Respiratory:  Respirations regular and unlabored, clear to auscultation bilaterally. GI: Soft, nontender, nondistended, BS + x 4. MS: no deformity or atrophy. Skin: warm and dry, no rash. Neuro:  Strength and sensation are intact. Psych: Normal affect.  Accessory Clinical Findings    Recent Labs: 07/26/2021: ALT 14; BUN 25; Creatinine, Ser 0.97; Hemoglobin 12.7; Platelets 215.0; Potassium 3.8; Sodium 140   Recent Lipid Panel No results found for: CHOL, TRIG, HDL, CHOLHDL, VLDL, LDLCALC, LDLDIRECT  ECG personally reviewed by me today-sinus rhythm with first-degree AV block with frequent and consecutive premature ventricular complexes 87 bpm- No acute changes  EKG 06/04/2020 sinus rhythm with marked sinus arrhythmia with first-degree block 72 bpm- No acute changes   Chest CT without contrast 05/15/2019 COMPARISON:  CT abdomen pelvis 02/13/2018 and CT chest 09/16/2008.   FINDINGS: Cardiovascular: Atherosclerotic calcification of the aorta, aortic valve and coronary arteries. Heart size normal. No pericardial effusion.   Mediastinum/Nodes: No pathologically enlarged mediastinal or axillary lymph nodes. Hilar regions are difficult to definitively evaluate without IV contrast but appear grossly unremarkable. Esophagus is somewhat dilated and fluid-filled.   Lungs/Pleura: Biapical pleuroparenchymal scarring. Centrilobular and paraseptal emphysema. Mild scarring in the peripheral lower lobes. 4 mm right lower lobe nodule (8/78), unchanged from 09/16/2008. Calcified granulomas. No pleural fluid. Airway is unremarkable.   Upper Abdomen: Low-attenuation lesions in the liver measure up to 3.1 cm in the left hepatic lobe, similar and likely cysts. Visualized portions of the liver, gallbladder and  adrenal glands are otherwise unremarkable. 4.7 cm low-attenuation lesion off the posterior right kidney is likely a cyst. Visualized portions of the kidneys, spleen, pancreas, stomach and bowel are otherwise unremarkable with the exception of a moderate hiatal hernia. Soft tissue lesion along the periphery of the right pararenal space (2/143), unchanged and likely a benign pseudotumor.   Musculoskeletal: Degenerative changes in the spine.  No worrisome lytic or sclerotic lesions. Flowing anterior osteophytosis in the thoracic spine.   IMPRESSION: 1. Right lower lobe nodule is unchanged from 09/16/2008 and is considered benign. No worrisome pulmonary nodules. 2. Esophagus is mildly dilated and fluid-filled, indicating dysmotility. 3. Moderate hiatal hernia. 4. Aortic atherosclerosis (ICD10-I70.0). Coronary artery calcification. 5.  Emphysema (ICD10-J43.9).   Echocardiogram 05/17/2019 IMPRESSIONS     1. Left ventricular ejection fraction, by visual estimation, is 60 to  65%. The left ventricle has normal function. There is moderately increased  left ventricular hypertrophy.   2. The left ventricle has no regional wall motion abnormalities.   3. Global right ventricle has normal systolic function.The right  ventricular size is normal. No increase in right ventricular wall  thickness.   4. Left atrial size was normal.   5. Right atrial size was normal.   6. Trivial pericardial effusion is present.   7. Cystic appearing structure in the liver. Recommend dedicated liver  imaging if clinically indicated.   8. The mitral valve is normal in structure. Trivial mitral valve  regurgitation.   9. The tricuspid valve is normal in structure.  10. The tricuspid valve is normal in structure. Tricuspid valve  regurgitation is trivial.  11. The aortic valve is tricuspid. Aortic valve regurgitation is not  visualized. Mild aortic valve stenosis.  12. The pulmonic valve was grossly normal.  Pulmonic valve regurgitation is  trivial.  13. Normal pulmonary artery systolic pressure.  14. The inferior vena cava is normal in size with greater than 50%  respiratory variability, suggesting right atrial pressure of 3 mmHg.   Echocardiogram 06/24/2020 IMPRESSIONS     1. Left ventricular ejection fraction, by estimation, is 60 to 65%. The  left ventricle has normal function. The left ventricle has no regional  wall motion abnormalities. There is moderate asymmetric left ventricular  hypertrophy of the basal-septal  segment. Left ventricular diastolic parameters are consistent with Grade  II diastolic dysfunction (pseudonormalization).   2. Right ventricular systolic function is normal. The right ventricular  size is normal.   3. The mitral valve is grossly normal. Trivial mitral valve  regurgitation.   4. The aortic valve is tricuspid. There is moderate calcification of the  aortic valve. There is moderate thickening of the aortic valve. Aortic  valve regurgitation is not visualized. Mild aortic valve stenosis. AVA by  continuity equation is 1.4cm2, mean   gradient 16mHg, peak gradient 232mg, Vmax 2.5 m/s, DI 0.37.   5. The inferior vena cava is normal in size with <50% respiratory  variability, suggesting right atrial pressure of 8 mmHg.   6. A cystic structure is seen in the liver. Recommend dedicated imaging  if not already pursued   Comparison(s): No significant change from prior study.  Echocardiogram 08/03/2021  IMPRESSIONS     1. Compared to 06/24/20, AS remains mild.   2. Left ventricular ejection fraction, by estimation, is 60 to 65%. The  left ventricle has normal function. The left ventricle has no regional  wall motion abnormalities. Left ventricular diastolic parameters were  normal.   3. Right ventricular systolic function is normal. The right ventricular  size is normal.   4. The mitral valve is normal in structure. Mild mitral valve  regurgitation. No  evidence of mitral stenosis.   5. The aortic valve is calcified. Aortic valve regurgitation is not  visualized. Mild aortic valve stenosis.   6. The inferior vena cava is normal in size with greater than 50%  respiratory  variability, suggesting right atrial pressure of 3 mmHg.   FINDINGS   Left Ventricle: Left ventricular ejection fraction, by estimation, is 60  to 65%. The left ventricle has normal function. The left ventricle has no  regional wall motion abnormalities. The left ventricular internal cavity  size was normal in size. There is   no left ventricular hypertrophy. Left ventricular diastolic parameters  were normal.   Right Ventricle: The right ventricular size is normal. Right ventricular  systolic function is normal.   Left Atrium: Left atrial size was normal in size.   Right Atrium: Right atrial size was normal in size.   Pericardium: There is no evidence of pericardial effusion.   Mitral Valve: The mitral valve is normal in structure. Mild mitral annular  calcification. Mild mitral valve regurgitation. No evidence of mitral  valve stenosis.   Tricuspid Valve: The tricuspid valve is normal in structure. Tricuspid  valve regurgitation is trivial. No evidence of tricuspid stenosis.   Aortic Valve: The aortic valve is calcified. Aortic valve regurgitation is  not visualized. Mild aortic stenosis is present. Aortic valve mean  gradient measures 14.5 mmHg. Aortic valve peak gradient measures 29.5  mmHg. Aortic valve area, by VTI measures  1.82 cm.   Pulmonic Valve: The pulmonic valve was normal in structure. Pulmonic valve  regurgitation is not visualized. No evidence of pulmonic stenosis.   Aorta: The aortic root is normal in size and structure.   Venous: The inferior vena cava is normal in size with greater than 50%  respiratory variability, suggesting right atrial pressure of 3 mmHg.   IAS/Shunts: The interatrial septum is aneurysmal. No atrial level shunt   detected by color flow Doppler.   Additional Comments: Compared to 06/24/20, AS remains mild.     Assessment & Plan   1.  Fatigue/bilateral lower extremity weakness-unchanged in the last 6 months.  Repeat echocardiogram showed no significant changes from prior study.  Normal LVEF.  Details above.   Continue increase physical activity as tolerated Maintain heart healthy diet Reassured that his fatigue and lower extremity weakness was not related to cardiac issues.   Dyspnea-no increased DOE.  Echocardiogram reassuring.  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 Heart healthy low-sodium diet-salty 6 reviewed  Cardiac murmur-unchanged/stable.  Echocardiogram 08/03/2021 showed stable LVEF and stable mild AS.  Details above.    Essential hypertension-BP today 102/60.   Continue amlodipine, hydrochlorothiazide Heart healthy low-sodium diet-salty 6 -reviewed Increase physical activity as tolerated      Disposition: Follow-up with Dr. Stanford Breed  in 6 months.   Jossie Ng. Williard Keller NP-C    09/20/2021, 11:35 AM [place] [address] Office [phone] Fax [phone]  Notice: This dictation was prepared with Dragon dictation along with smaller phrase technology. Any transcriptional errors that result from this process are unintentional and may not be corrected upon review.  I spent 14 minutes examining this patient, reviewing medications, and using patient centered shared decision making involving her cardiac care.  Prior to her visit I spent greater than 20 minutes reviewing her past medical history,  medications, and prior cardiac tests.

## 2021-09-15 ENCOUNTER — Other Ambulatory Visit: Payer: Self-pay | Admitting: Physician Assistant

## 2021-09-20 ENCOUNTER — Encounter: Payer: Self-pay | Admitting: General Practice

## 2021-09-20 ENCOUNTER — Ambulatory Visit: Payer: Medicare Other | Admitting: General Practice

## 2021-09-20 VITALS — BP 102/60 | HR 52 | Ht 71.0 in | Wt 192.2 lb

## 2021-09-20 DIAGNOSIS — R0602 Shortness of breath: Secondary | ICD-10-CM | POA: Diagnosis not present

## 2021-09-20 DIAGNOSIS — I10 Essential (primary) hypertension: Secondary | ICD-10-CM | POA: Diagnosis not present

## 2021-09-20 DIAGNOSIS — R011 Cardiac murmur, unspecified: Secondary | ICD-10-CM

## 2021-09-20 DIAGNOSIS — R5383 Other fatigue: Secondary | ICD-10-CM | POA: Diagnosis not present

## 2021-09-20 DIAGNOSIS — R29898 Other symptoms and signs involving the musculoskeletal system: Secondary | ICD-10-CM

## 2021-09-20 NOTE — Patient Instructions (Signed)
Medication Instructions:  The current medical regimen is effective;  continue present plan and medications as directed. Please refer to the Current Medication list given to you today.   *If you need a refill on your cardiac medications before your next appointment, please call your pharmacy*  Lab Work:   Testing/Procedures:  NONE    NONE If you have labs (blood work) drawn today and your tests are completely normal, you will receive your results only by: [place] (if you have MyChart) OR  A paper copy in the mail If you have any lab test that is abnormal or we need to change your treatment, we will call you to review the results.  Follow-Up: Your next appointment:  9-12 month(s) In Person with Kirk Ruths, MD     Please call our office 2 months in advance to schedule this appointment   At [facility], you and your health needs are our priority.  As part of our continuing mission to provide you with exceptional heart care, we have created designated Provider Care Teams.  These Care Teams include your primary Cardiologist (physician) and Advanced Practice Providers (APPs -  Physician Assistants and Nurse Practitioners) who all work together to provide you with the care you need, when you need it.    Important Information About Sugar

## 2021-12-01 DIAGNOSIS — R1032 Left lower quadrant pain: Secondary | ICD-10-CM | POA: Diagnosis not present

## 2021-12-03 ENCOUNTER — Other Ambulatory Visit: Payer: Self-pay | Admitting: Family Medicine

## 2021-12-03 DIAGNOSIS — K5792 Diverticulitis of intestine, part unspecified, without perforation or abscess without bleeding: Secondary | ICD-10-CM

## 2021-12-06 ENCOUNTER — Ambulatory Visit (INDEPENDENT_AMBULATORY_CARE_PROVIDER_SITE_OTHER): Payer: Medicare Other

## 2021-12-06 DIAGNOSIS — K5792 Diverticulitis of intestine, part unspecified, without perforation or abscess without bleeding: Secondary | ICD-10-CM | POA: Diagnosis not present

## 2021-12-06 DIAGNOSIS — R109 Unspecified abdominal pain: Secondary | ICD-10-CM | POA: Diagnosis not present

## 2021-12-06 MED ORDER — IOHEXOL 300 MG/ML  SOLN
100.0000 mL | Freq: Once | INTRAMUSCULAR | Status: AC | PRN
  Administered 2021-12-06: 100 mL via INTRAVENOUS

## 2021-12-09 DIAGNOSIS — I10 Essential (primary) hypertension: Secondary | ICD-10-CM | POA: Diagnosis not present

## 2021-12-09 DIAGNOSIS — J449 Chronic obstructive pulmonary disease, unspecified: Secondary | ICD-10-CM | POA: Diagnosis not present

## 2021-12-09 DIAGNOSIS — K219 Gastro-esophageal reflux disease without esophagitis: Secondary | ICD-10-CM | POA: Diagnosis not present

## 2022-01-25 DIAGNOSIS — Z23 Encounter for immunization: Secondary | ICD-10-CM | POA: Diagnosis not present

## 2022-01-25 DIAGNOSIS — R1032 Left lower quadrant pain: Secondary | ICD-10-CM | POA: Diagnosis not present

## 2022-02-10 ENCOUNTER — Other Ambulatory Visit: Payer: Self-pay | Admitting: Gastroenterology

## 2022-03-15 DIAGNOSIS — H6123 Impacted cerumen, bilateral: Secondary | ICD-10-CM | POA: Diagnosis not present

## 2022-03-15 DIAGNOSIS — H903 Sensorineural hearing loss, bilateral: Secondary | ICD-10-CM | POA: Diagnosis not present

## 2022-03-18 DIAGNOSIS — H524 Presbyopia: Secondary | ICD-10-CM | POA: Diagnosis not present

## 2022-03-18 DIAGNOSIS — H2513 Age-related nuclear cataract, bilateral: Secondary | ICD-10-CM | POA: Diagnosis not present

## 2022-03-28 DIAGNOSIS — J069 Acute upper respiratory infection, unspecified: Secondary | ICD-10-CM | POA: Diagnosis not present

## 2022-04-04 DIAGNOSIS — R059 Cough, unspecified: Secondary | ICD-10-CM | POA: Diagnosis not present

## 2022-04-19 ENCOUNTER — Ambulatory Visit: Payer: Medicare Other | Admitting: Gastroenterology

## 2022-04-22 ENCOUNTER — Other Ambulatory Visit: Payer: Self-pay | Admitting: Family Medicine

## 2022-04-22 ENCOUNTER — Ambulatory Visit
Admission: RE | Admit: 2022-04-22 | Discharge: 2022-04-22 | Disposition: A | Discharge status: Home / Self Care | Payer: Medicare Other | Source: Ambulatory Visit | Attending: Family Medicine | Admitting: Family Medicine

## 2022-04-22 DIAGNOSIS — R051 Acute cough: Secondary | ICD-10-CM

## 2022-04-22 DIAGNOSIS — R059 Cough, unspecified: Secondary | ICD-10-CM | POA: Diagnosis not present

## 2022-04-26 ENCOUNTER — Encounter: Payer: Self-pay | Admitting: Gastroenterology

## 2022-05-09 ENCOUNTER — Encounter (HOSPITAL_BASED_OUTPATIENT_CLINIC_OR_DEPARTMENT_OTHER): Payer: Self-pay

## 2022-05-09 ENCOUNTER — Other Ambulatory Visit: Payer: Self-pay | Admitting: Physician Assistant

## 2022-05-09 ENCOUNTER — Emergency Department (HOSPITAL_BASED_OUTPATIENT_CLINIC_OR_DEPARTMENT_OTHER)
Admission: EM | Admit: 2022-05-09 | Discharge: 2022-05-09 | Disposition: A | Discharge status: Home / Self Care | Payer: Medicare Other | Attending: Emergency Medicine | Admitting: Emergency Medicine

## 2022-05-09 ENCOUNTER — Emergency Department (HOSPITAL_BASED_OUTPATIENT_CLINIC_OR_DEPARTMENT_OTHER): Payer: Medicare Other

## 2022-05-09 DIAGNOSIS — R079 Chest pain, unspecified: Secondary | ICD-10-CM | POA: Diagnosis not present

## 2022-05-09 DIAGNOSIS — R0789 Other chest pain: Secondary | ICD-10-CM | POA: Diagnosis not present

## 2022-05-09 DIAGNOSIS — J9811 Atelectasis: Secondary | ICD-10-CM | POA: Diagnosis not present

## 2022-05-09 LAB — CBC
HCT: 41 % (ref 39.0–52.0)
Hemoglobin: 13.5 g/dL (ref 13.0–17.0)
MCH: 29.3 pg (ref 26.0–34.0)
MCHC: 32.9 g/dL (ref 30.0–36.0)
MCV: 88.9 fL (ref 80.0–100.0)
Platelets: 341 10*3/uL (ref 150–400)
RBC: 4.61 MIL/uL (ref 4.22–5.81)
RDW: 14.3 % (ref 11.5–15.5)
WBC: 7.5 10*3/uL (ref 4.0–10.5)
nRBC: 0 % (ref 0.0–0.2)

## 2022-05-09 LAB — BASIC METABOLIC PANEL
Anion gap: 10 (ref 5–15)
BUN: 22 mg/dL (ref 8–23)
CO2: 26 mmol/L (ref 22–32)
Calcium: 9.1 mg/dL (ref 8.9–10.3)
Chloride: 102 mmol/L (ref 98–111)
Creatinine, Ser: 0.75 mg/dL (ref 0.61–1.24)
GFR, Estimated: >60 mL/min (ref >60)
Glucose, Bld: 99 mg/dL (ref 70–99)
Potassium: 3.5 mmol/L (ref 3.5–5.1)
Sodium: 138 mmol/L (ref 135–145)

## 2022-05-09 LAB — TROPONIN I (HIGH SENSITIVITY)
Troponin I (High Sensitivity): 5 ng/L (ref <18)
Troponin I (High Sensitivity): 5 ng/L (ref <18)

## 2022-05-09 MED ORDER — PANTOPRAZOLE SODIUM 40 MG PO TBEC: 40.0000 mg | DELAYED_RELEASE_TABLET | Freq: Every day | ORAL | 0 refills | Status: DC

## 2022-05-09 NOTE — ED Triage Notes (Signed)
C/o chest pain to left chest starting at 0630. Sent by [facility] for bloodwork.

## 2022-05-09 NOTE — Discharge Instructions (Signed)
Take the antacid as prescribed.  Follow-up with your doctor to be rechecked.  Return as needed for worsening symptoms

## 2022-05-09 NOTE — ED Notes (Signed)
Pt states has cp that comes and goes about every  45 sec

## 2022-05-09 NOTE — ED Provider Notes (Signed)
[facility] Provider Note   CSN: [id number] Arrival date & time: 05/09/22  1238     History  Chief Complaint  Patient presents with   Chest Pain    Samuel [name] is a 82 y.o. male.   Chest Pain Patient has history of hypertension, diverticulosis, GERD, hiatal hernia, heart murmur   Pt has been having mild to moderate sharp pain in the left chest, it feels like a muscle spasm.  He has had some gas acid reflux feeling as well.  Today he had an episode that did not go away. It started around 6 or 7 am.  No fever or cough.  No shortness of breath.  No leg swelling.   Home Medications Prior to Admission medications   Medication Sig Start Date End Date Taking? Authorizing Provider  pantoprazole (PROTONIX) 40 MG tablet Take 1 tablet (40 mg total) by mouth daily. 05/09/22  Yes Dorie Rank, MD  amLODipine (NORVASC) 5 MG tablet Take 5 mg by mouth daily.    [provider]  aspirin 81 MG tablet Take 81 mg by mouth daily.    [provider]  Cholecalciferol (VITAMIN D3) 2000 UNITS TABS Take 1 tablet by mouth daily.     [provider]  clidinium-chlordiazePOXIDE (LIBRAX) 5-2.5 MG capsule Take 1 capsule by mouth 3 (three) times daily as needed. 04/29/21   Ladene Artist, MD  glycopyrrolate (ROBINUL) 2 MG tablet TAKE ONE TABLET BY MOUTH TWICE DAILY 05/09/22   Esterwood, Amy S, PA-C  hydrochlorothiazide (HYDRODIURIL) 25 MG tablet Take 25 mg by mouth daily.    [provider]  Turmeric 500 MG TABS Take 1 tablet by mouth daily.    [provider]  vitamin E 180 MG (400 UNITS) capsule Take 400 Units by mouth daily.    [provider]  Zinc 50 MG TABS Take 1 tablet by mouth daily.    [provider]      Allergies    Codeine    Review of Systems   Review of Systems  Cardiovascular:  Positive for chest pain.    Physical Exam Updated Vital Signs BP (!) 160/87   Pulse 74   Temp 98.2  F (36.8 C) (Oral)   Resp 17   Ht 1.803 m ('5\' 11"'$ )   Wt 84.8 kg   SpO2 91%   BMI 26.08 kg/m  Physical Exam Vitals and nursing note reviewed.  Constitutional:      General: He is not in acute distress.    Appearance: He is well-developed.  HENT:     Head: Normocephalic and atraumatic.     Right Ear: External ear normal.     Left Ear: External ear normal.  Eyes:     General: No scleral icterus.       Right eye: No discharge.        Left eye: No discharge.     Conjunctiva/sclera: Conjunctivae normal.  Neck:     Trachea: No tracheal deviation.  Cardiovascular:     Rate and Rhythm: Normal rate and regular rhythm.  Pulmonary:     Effort: Pulmonary effort is normal. No respiratory distress.     Breath sounds: Normal breath sounds. No stridor. No wheezing or rales.  Abdominal:     General: Bowel sounds are normal. There is no distension.     Palpations: Abdomen is soft.     Tenderness: There is no abdominal tenderness. There is no guarding  or rebound.  Musculoskeletal:        General: No tenderness or deformity.     Cervical back: Neck supple.  Skin:    General: Skin is warm and dry.     Findings: No rash.  Neurological:     General: No focal deficit present.     Mental Status: He is alert.     Cranial Nerves: No cranial nerve deficit, dysarthria or facial asymmetry.     Sensory: No sensory deficit.     Motor: No abnormal muscle tone or seizure activity.     Coordination: Coordination normal.  Psychiatric:        Mood and Affect: Mood normal.     ED Results / Procedures / Treatments   Labs (all labs ordered are listed, but only abnormal results are displayed) Labs Reviewed  BASIC METABOLIC PANEL  CBC  TROPONIN I (HIGH SENSITIVITY)  TROPONIN I (HIGH SENSITIVITY)    EKG EKG Interpretation  Date/Time:  Monday May 09 2022 12:52:35 EST Ventricular Rate:  75 PR Interval:  214 QRS Duration: 100 QT Interval:  397 QTC Calculation: 444 R Axis:   44 Text  Interpretation: Sinus rhythm Multiform ventricular premature complexes , new since last tracing Borderline prolonged PR interval PVC new since last tracing Reconfirmed by Dorie Rank [phone]) on 05/09/2022 3:12:04 PM  Radiology DG Chest 2 View  Result Date: 05/09/2022 CLINICAL DATA:  Chest pain EXAM: CHEST - 2 VIEW COMPARISON:  Chest x-ray dated April 22, 2022 FINDINGS: The heart size and mediastinal contours are within normal limits. Mild bibasilar atelectasis. Both lungs are otherwise clear. The visualized skeletal structures are unremarkable. IMPRESSION: No active cardiopulmonary disease. Electronically Signed   By: Yetta Glassman M.D.   On: 05/09/2022 13:24    Procedures Procedures    Medications Ordered in ED Medications - No data to display  ED Course/ Medical Decision Making/ A&P Clinical Course as of 05/09/22 1732  Mon May 09, 2022  1510 CBC CBC and bmet normal [JK]  1510 Trop normal [JK]  2683 CXR without acute findings [JK]    Clinical Course User Index [JK] Dorie Rank, MD             HEART Score: 3                Medical Decision Making Problems Addressed: Chest pain, unspecified type: acute illness or injury that poses a threat to life or bodily functions  Amount and/or Complexity of Data Reviewed Labs: ordered. Decision-making details documented in ED Course. Radiology: ordered and independent interpretation performed.  Risk Prescription drug management.   Patient presented to the ED for evaluation of chest pain.  ED workup reassuring.  Low risk heart score.  Serial troponins normal.  Doubt acute coronary syndrome.  Patient without difficulty breathing.  No signs of pneumonia.  Patient does have history of acid reflux.  It is possible this could be contributing to his symptoms.  Will have him try taking antacids outpatient follow-up with his cardiologist. Evaluation and diagnostic testing in the emergency department does not suggest an emergent condition  requiring admission or immediate intervention beyond what has been performed at this time.  The patient is safe for discharge and has been instructed to return immediately for worsening symptoms, change in symptoms or any other concerns.         Final Clinical Impression(s) / ED Diagnoses Final diagnoses:  Chest pain, unspecified type    Rx / DC Orders ED Discharge Orders  Ordered    pantoprazole (PROTONIX) 40 MG tablet  Daily        05/09/22 1730    Ambulatory referral to Cardiology       Comments: If you have not heard from the Cardiology office within the next 72 hours please call [phone].   05/09/22 1731              Dorie Rank, MD 05/09/22 1732

## 2022-06-06 ENCOUNTER — Ambulatory Visit: Payer: Medicare Other | Admitting: Gastroenterology

## 2022-06-06 ENCOUNTER — Other Ambulatory Visit (INDEPENDENT_AMBULATORY_CARE_PROVIDER_SITE_OTHER): Payer: Medicare Other

## 2022-06-06 ENCOUNTER — Encounter: Payer: Self-pay | Admitting: Gastroenterology

## 2022-06-06 VITALS — BP 120/58 | HR 73 | Ht 71.0 in | Wt 194.0 lb

## 2022-06-06 DIAGNOSIS — K2270 Barrett's esophagus without dysplasia: Secondary | ICD-10-CM

## 2022-06-06 DIAGNOSIS — K219 Gastro-esophageal reflux disease without esophagitis: Secondary | ICD-10-CM

## 2022-06-06 DIAGNOSIS — R079 Chest pain, unspecified: Secondary | ICD-10-CM

## 2022-06-06 LAB — HEPATIC FUNCTION PANEL
ALT: 19 U/L (ref 0–53)
AST: 22 U/L (ref 0–37)
Albumin: 4.3 g/dL (ref 3.5–5.2)
Alkaline Phosphatase: 66 U/L (ref 39–117)
Bilirubin, Direct: 0.1 mg/dL (ref 0.0–0.3)
Total Bilirubin: 0.6 mg/dL (ref 0.2–1.2)
Total Protein: 7.3 g/dL (ref 6.0–8.3)

## 2022-06-06 MED ORDER — PANTOPRAZOLE SODIUM 40 MG PO TBEC: 40.0000 mg | DELAYED_RELEASE_TABLET | Freq: Two times a day (BID) | ORAL | 11 refills | Status: DC

## 2022-06-06 NOTE — Progress Notes (Addendum)
Assessment     Epigastric and substernal chest pain - likely GERD. R/O esophagitis, ulcer, gastritis, neoplasm Sharp left chest pains do not appear to be GI related Short segment Barrett's esophagus previously noted however Barrett's was not noted on his last biopsies in 2018.  He is no longer in surveillance Medium-sized hiatal hernia IBS Personal history of adenomatous colon polyps, no longer in surveillance   Recommendations    Follow-up with cardiology for further evaluation of chest pain as planned. Increase pantoprazole to 40 mg bid, start Pepcid ac bid prn, continue Mylanta qid prn and follow antireflux measures Continue glycopyrrolate 2 mg twice daily LFTs today Schedule EGD. The risks (including bleeding, perforation, infection, missed lesions, medication reactions and possible hospitalization or surgery if complications occur), benefits, and alternatives to endoscopy with possible biopsy and possible dilation were discussed with the patient and they consent to proceed.     HPI    This is an 82 year old male with chest pain, epigastric pain for several months. He is accompanied by his wife.  He was evaluated at [facility] ED for sharp stabbing left sided chest pains, epigastric pain, mid chest pain and GERD symptoms on May 09, 2021.  Serial troponins and chest x-ray were negative.  He described months of epigastric pain, heartburn different from the sharp stabbing left chest pains.  He has had persistent problems with epigastric pain and lower sternal area pain that is temporarily improved with Maalox.  He occasionally has lower left chest sharp shooting pains that have a very different quality than his epigastric and substernal pains.  His pain is not exertional.  He denies SOB, DOE. He is scheduled for a cardiology appointment on March 1st. Denies weight loss, constipation, diarrhea, change in stool caliber, melena, hematochezia, nausea, vomiting,  dysphagia   EGD July 2018   Colonoscopy July 2018    Labs / Imaging     CT AP 12/07/2021 There is no evidence of intestinal obstruction or pneumoperitoneum. Appendix is not dilated. There is no hydronephrosis.   Diverticulosis of colon. There is minimal stranding in the fat planes adjacent to the junction of descending and sigmoid colon. This may suggest chronic inflammation. Less likely possibility would be mild acute diverticulitis. There is no loculated pericolic abscess.   Hepatic and renal cysts. Small to moderate sized hiatal hernia. Coronary artery calcifications are seen.   Other findings as described in the body of the report.      Latest Ref Rng & Units 07/26/2021    3:18 PM 02/23/2017    4:00 PM 02/03/2011   11:15 AM  Hepatic Function  Total Protein 6.0 - 8.3 g/dL 7.2  7.1  7.3   Albumin 3.5 - 5.2 g/dL 4.2  4.0  3.9   AST 0 - 37 U/L 17  34  28   ALT 0 - 53 U/L 14  32  21   Alk Phosphatase 39 - 117 U/L 69  58  90   Total Bilirubin 0.2 - 1.2 mg/dL 0.4  0.3  0.3        Latest Ref Rng & Units 05/09/2022   12:59 PM 07/26/2021    3:18 PM 02/23/2017   10:09 AM  CBC  WBC 4.0 - 10.5 K/uL 7.5  7.5  5.0   Hemoglobin 13.0 - 17.0 g/dL 13.5  12.7  13.5   Hematocrit 39.0 - 52.0 % 41.0  38.0  40.6   Platelets 150 - 400 K/uL  341  215.0  198.0      DG Chest 2 View  Result Date: 05/09/2022 CLINICAL DATA:  Chest pain EXAM: CHEST - 2 VIEW COMPARISON:  Chest x-ray dated April 22, 2022 FINDINGS: The heart size and mediastinal contours are within normal limits. Mild bibasilar atelectasis. Both lungs are otherwise clear. The visualized skeletal structures are unremarkable. IMPRESSION: No active cardiopulmonary disease. Electronically Signed   By: Yetta Glassman M.D.   On: 05/09/2022 13:24    Current Medications, Allergies, Past Medical History, Past Surgical History, Family History and Social History were reviewed in [employer] record.   Physical  Exam: General: Well developed, well nourished, no acute distress Head: Normocephalic and atraumatic Eyes: Sclerae anicteric, EOMI Ears: Normal auditory acuity Mouth: No deformities or lesions noted Lungs: Clear throughout to auscultation Chest: No anterior chest wall tenderness Heart: Regular rate and rhythm; No rubs or bruits; 3/6 systolic murmur Abdomen: Soft, mild epigastric tenderness and non distended. No masses, hepatosplenomegaly or hernias noted. Normal Bowel sounds Rectal: Not done Musculoskeletal: Symmetrical with no gross deformities  Pulses:  Normal pulses noted Extremities: No edema or deformities noted Neurological: Alert oriented x 4, grossly nonfocal Psychological:  Alert and cooperative. Normal mood and affect   Norissa Bartee T. Fuller Plan, MD 06/06/2022, 2:30 PM

## 2022-06-06 NOTE — Patient Instructions (Addendum)
Your provider has requested that you go to the basement level for lab work before leaving today. Press "B" on the elevator. The lab is located at the first door on the left as you exit the elevator.  Increase your pantoprazole to 40 mg twice daily. A new prescription has been sent to your pharmacy.  Patient advised to avoid spicy, acidic, citrus, chocolate, mints, fruit and fruit juices.  Limit the intake of caffeine, alcohol and Soda.  Don't exercise too soon after eating.  Don't lie down within 3-4 hours of eating.  Elevate the head of your bed.  You can take over the counter Pepcid AC as needed for breakthrough reflux symptoms.   You have been scheduled for an endoscopy. Please follow written instructions given to you at your visit today. If you use inhalers (even only as needed), please bring them with you on the day of your procedure.  The [facility] providers would like to encourage you to use [facility] to communicate with providers for non-urgent requests or questions.  Due to long hold times on the telephone, sending your provider a message by [facility] may be a faster and more efficient way to get a response.  Please allow 48 business hours for a response.  Please remember that this is for non-urgent requests.   Due to recent changes in healthcare laws, you may see the results of your imaging and laboratory studies on MyChart before your provider has had a chance to review them.  We understand that in some cases there may be results that are confusing or concerning to you. Not all laboratory results come back in the same time frame and the provider may be waiting for multiple results in order to interpret others.  Please give [place] 48 hours in order for your provider to thoroughly review all the results before contacting the office for clarification of your results.   Thank you for choosing me and [facility].  Pricilla Riffle. Dagoberto Ligas., MD., Marval Regal

## 2022-06-14 NOTE — Progress Notes (Signed)
Cardiology Clinic Note   Patient Name: Samuel [name] Date of Encounter: 06/17/2022  Primary Care Provider:  Orpah Melter, MD Primary Cardiologist:  Kirk Ruths, MD  Patient Profile    Samuel [name] 82 year old male presents the clinic today for follow-up evaluation of his hypertension and chest discomfort.  Past Medical History    Past Medical History:  Diagnosis Date   Arthritis    Diverticulosis    GERD (gastroesophageal reflux disease)    Heart murmur    Hemorrhoids    Hiatal hernia    Hypertension    Tubular adenoma of colon 01/2009   Past Surgical History:  Procedure Laterality Date   COLONOSCOPY     HERNIA REPAIR     POLYPECTOMY     REPLACEMENT TOTAL KNEE  02/14/11   left knee    Allergies  Allergies  Allergen Reactions   Codeine     REACTION: Agitation    History of Present Illness    Samuel [name] has a PMH of hypertension, diverticulosis, GERD, Barrett's esophagus, and chest discomfort.  His PMH also includescardiac murmur, dyspnea, and lung nodule.   He was initially referred by his PCP Christella Noa.  He was seen in initial evaluation 7/17.  His echocardiogram 6/17 showed normal LV function, severe left atrial enlargement, and mild right atrial enlargement.  A nuclear stress test 7/17 showed an EF of 57%, no ischemia and no infarction.  An abdominal CT 10/19 showed aortic atherosclerosis but no aneurysm.  A 6 mm nodule in the right upper lobe and follow-up imaging was recommended for 6-12 months.  He was last seen by Dr. Stanford Breed on 05/07/2019.  During that time he denied orthopnea, PND, lower extremity swelling, exertional chest pain, and syncope.   His follow-up CT showed a 4 mm right lower lobe nodule unchanged from previous scan.  The nodule was felt to be benign.   He presented the clinic 06/04/2020 for follow-up evaluation stated he occasionally noticed chest discomfort in the evening after eating a big meal.  He reported he presented to his PCP  who prescribed Pepcid along with his Protonix.  He felt that it made some difference.  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  He also reported that he was able to walk up and down 2 flights of stairs at church.  He denied chest discomfort with the activities.    He reported that he regularly drank beet juice which had been helping with his blood pressure at home.  It had been ranging in the 120s-130s over 70s.  I ordered a echocardiogram for further evaluation, continued his current medications, requested his lab work from his PCP, gave salty 6 diet sheet, and planned follow-up in a year.  His echocardiogram 06/24/2020 showed an LVEF of 60-65% moderate LVH and G2 DD.  He presentd to the clinic 07/19/21 for follow-up evaluation stated he had noticed leg weakness for the last 4-5 months.  He reported that he first noted leg weakness 07/22/2019 after receiving the [place] shot.  His symptoms progressively improved.  He felt he had returned to normal and was doing all of his normal activities and work around his property.  However, over the last 4-5 months he has noticed consistent weakness.  He reported that he noticed weakness even when he woke up in the morning.  He denied claudication.  He felt that  his breathing was not quite as good as it was.  He had extensive lab work drawn at his PCP which I  requested for review.  I ordered an echocardiogram, asked him increase his physical activity , continue his diet and follow-up after echocardiogram.  His echocardiogram 08/03/2021 showed normal LVEF mild mitral valve regurgitation and no other significant valvular abnormalities.  He presented to the clinic 09/20/2021 for follow-up evaluation and stated his lower extremity weakness was unchanged.  He continued to be very physically active around his property.  He reportd that he did subdivided his 130 acres but kept 37 acres.  He was thinking  about buying a new [employer] 0 turn mower.  He continues to make Hay, and keep beef cattle.  We reviewed his echocardiogram and he expressed understanding.  I reassured him and his wife that it is weakness and was not related to cardiac issues.  I planned follow-up in 9 to 12 months with Dr. Stanford Breed.   He presents to the clinic today for follow-up evaluation and states he was recently in the emergency department 05/09/2022.  We reviewed his emergency department visit and his testing.  He and his wife expressed understanding.  He has since followed up with GI who recommend endoscopy.  His Protonix was doubled and he is also taking Prilosec.  He has had significant reduction of his epigastric symptoms.  He has also changed his diet and is avoiding irritating type foods.  We will continue his current medical therapy and plan follow-up in 9 months.  Today he denies chest pain, shortness of breath, lower extremity edema, fatigue, palpitations, melena, hematuria, hemoptysis, diaphoresis, weakness, presyncope, syncope, orthopnea, and PND.   Home Medications    Prior to Admission medications   Medication Sig Start Date End Date Taking? Authorizing Provider  amLODipine (NORVASC) 5 MG tablet Take 5 mg by mouth daily.    [provider]  aspirin 81 MG tablet Take 81 mg by mouth daily.    [provider]  Cholecalciferol (VITAMIN D3) 2000 UNITS TABS Take 1 tablet by mouth daily.     [provider]  clidinium-chlordiazePOXIDE (LIBRAX) 5-2.5 MG capsule Take 1 capsule by mouth 3 (three) times daily as needed. 04/29/21   Ladene Artist, MD  glycopyrrolate (ROBINUL) 2 MG tablet TAKE ONE TABLET BY MOUTH TWICE DAILY 01/25/21   Esterwood, Amy S, PA-C  hydrochlorothiazide (HYDRODIURIL) 25 MG tablet Take 25 mg by mouth daily.    [provider]  pantoprazole (PROTONIX) 40 MG tablet TAKE ONE TABLET BY MOUTH TWICE DAILY 07/05/21   Ladene Artist, MD  Turmeric 500 MG TABS Take 1  tablet by mouth daily.    [provider]  vitamin E 180 MG (400 UNITS) capsule Take 400 Units by mouth daily.    [provider]  Zinc 50 MG TABS Take 1 tablet by mouth daily.    [provider]    Family History    Family History  Problem Relation Age of Onset   Breast cancer Sister    Pancreatic cancer Brother    Colon cancer Neg Hx    Esophageal cancer Neg Hx    Rectal cancer Neg Hx    Stomach cancer Neg Hx    He indicated that his mother is deceased. He indicated that his father is deceased. He indicated that his sister is deceased. He indicated that his brother is deceased. He indicated that the status of his neg hx is unknown.  Social History    Social History   Socioeconomic History   Marital status: Married    Spouse name: Not on file   Number of children: 4   Years of education: Not on file   Highest education level: Not on file  Occupational History   Not on file  Tobacco Use   Smoking status: Former    Types: Cigarettes    Quit date: 02/21/1964    Years since quitting: 58.3   Smokeless tobacco: Never  Vaping Use   Vaping Use: Never used  Substance and Sexual Activity   Alcohol use: No   Drug use: No   Sexual activity: Not on file  Other Topics Concern   Not on file  Social History Narrative   Not on file   Social Determinants of Health   Financial Resource Strain: Not on file  Food Insecurity: Not on file  Transportation Needs: Not on file  Physical Activity: Not on file  Stress: Not on file  Social Connections: Not on file  Intimate Partner Violence: Not on file     Review of Systems    General:  No chills, fever, night sweats or weight changes.  Cardiovascular:  No chest pain, dyspnea on exertion, edema, orthopnea, palpitations, paroxysmal nocturnal dyspnea. Dermatological: No rash, lesions/masses Respiratory: No cough, dyspnea Urologic: No hematuria, dysuria Abdominal:   No nausea, vomiting, diarrhea, bright red  blood per rectum, melena, or hematemesis Neurologic:  No visual changes, wkns, changes in mental status. All other systems reviewed and are otherwise negative except as noted above.  Physical Exam    VS:  BP 134/84   Pulse 81   Ht '5\' 11"'$  (1.803 m)   Wt 192 lb 6.4 oz (87.3 kg)   SpO2 94%   BMI 26.83 kg/m  , BMI Body mass index is 26.83 kg/m. GEN: Well nourished, well developed, in no acute distress. HEENT: normal. Neck: Supple, no JVD, carotid bruits, or masses. Cardiac: RRR, 2/6 systolic murmur heard best along left sternal border, rubs, or gallops. No clubbing, cyanosis, edema.  Radials/DP/PT 2+ and equal bilaterally.  Respiratory:  Respirations regular and unlabored, clear to auscultation bilaterally. GI: Soft, nontender, nondistended, BS + x 4. MS: no deformity or atrophy. Skin: warm and dry, no rash. Neuro:  Strength and sensation are intact. Psych: Normal affect.  Accessory Clinical Findings    Recent Labs: 05/09/2022: BUN 22; Creatinine, Ser 0.75; Hemoglobin 13.5; Platelets 341; Potassium 3.5; Sodium 138 06/06/2022: ALT 19   Recent Lipid Panel No results found for: "CHOL", "TRIG", "HDL", "CHOLHDL", "VLDL", "LDLCALC", "LDLDIRECT"  ECG personally reviewed by me today-none today.  EKG 09/20/2021 sinus rhythm with first-degree AV block with frequent and consecutive premature ventricular complexes 87 bpm- No acute changes  EKG 06/04/2020 sinus rhythm with marked sinus arrhythmia with first-degree block 72 bpm- No acute changes   Chest CT without contrast 05/15/2019 COMPARISON:  CT abdomen pelvis 02/13/2018 and CT chest 09/16/2008.   FINDINGS: Cardiovascular: Atherosclerotic calcification of the aorta, aortic valve and coronary arteries. Heart size normal. No pericardial effusion.   Mediastinum/Nodes: No pathologically enlarged mediastinal or axillary lymph nodes. Hilar regions are difficult to definitively evaluate without IV contrast but appear grossly  unremarkable. Esophagus is somewhat dilated and fluid-filled.   Lungs/Pleura: Biapical pleuroparenchymal scarring. Centrilobular and paraseptal emphysema. Mild scarring in the peripheral lower lobes. 4 mm right lower lobe nodule (8/78), unchanged from 09/16/2008. Calcified granulomas. No pleural fluid. Airway is unremarkable.   Upper Abdomen: Low-attenuation lesions in the  liver measure up to 3.1 cm in the left hepatic lobe, similar and likely cysts. Visualized portions of the liver, gallbladder and adrenal glands are otherwise unremarkable. 4.7 cm low-attenuation lesion off the posterior right kidney is likely a cyst. Visualized portions of the kidneys, spleen, pancreas, stomach and bowel are otherwise unremarkable with the exception of a moderate hiatal hernia. Soft tissue lesion along the periphery of the right pararenal space (2/143), unchanged and likely a benign pseudotumor.   Musculoskeletal: Degenerative changes in the spine. No worrisome lytic or sclerotic lesions. Flowing anterior osteophytosis in the thoracic spine.   IMPRESSION: 1. Right lower lobe nodule is unchanged from 09/16/2008 and is considered benign. No worrisome pulmonary nodules. 2. Esophagus is mildly dilated and fluid-filled, indicating dysmotility. 3. Moderate hiatal hernia. 4. Aortic atherosclerosis (ICD10-I70.0). Coronary artery calcification. 5.  Emphysema (ICD10-J43.9).   Echocardiogram 05/17/2019 IMPRESSIONS     1. Left ventricular ejection fraction, by visual estimation, is 60 to  65%. The left ventricle has normal function. There is moderately increased  left ventricular hypertrophy.   2. The left ventricle has no regional wall motion abnormalities.   3. Global right ventricle has normal systolic function.The right  ventricular size is normal. No increase in right ventricular wall  thickness.   4. Left atrial size was normal.   5. Right atrial size was normal.   6. Trivial pericardial  effusion is present.   7. Cystic appearing structure in the liver. Recommend dedicated liver  imaging if clinically indicated.   8. The mitral valve is normal in structure. Trivial mitral valve  regurgitation.   9. The tricuspid valve is normal in structure.  10. The tricuspid valve is normal in structure. Tricuspid valve  regurgitation is trivial.  11. The aortic valve is tricuspid. Aortic valve regurgitation is not  visualized. Mild aortic valve stenosis.  12. The pulmonic valve was grossly normal. Pulmonic valve regurgitation is  trivial.  13. Normal pulmonary artery systolic pressure.  14. The inferior vena cava is normal in size with greater than 50%  respiratory variability, suggesting right atrial pressure of 3 mmHg.   Echocardiogram 06/24/2020 IMPRESSIONS     1. Left ventricular ejection fraction, by estimation, is 60 to 65%. The  left ventricle has normal function. The left ventricle has no regional  wall motion abnormalities. There is moderate asymmetric left ventricular  hypertrophy of the basal-septal  segment. Left ventricular diastolic parameters are consistent with Grade  II diastolic dysfunction (pseudonormalization).   2. Right ventricular systolic function is normal. The right ventricular  size is normal.   3. The mitral valve is grossly normal. Trivial mitral valve  regurgitation.   4. The aortic valve is tricuspid. There is moderate calcification of the  aortic valve. There is moderate thickening of the aortic valve. Aortic  valve regurgitation is not visualized. Mild aortic valve stenosis. AVA by  continuity equation is 1.4cm2, mean   gradient 76mHg, peak gradient 210mg, Vmax 2.5 m/s, DI 0.37.   5. The inferior vena cava is normal in size with <50% respiratory  variability, suggesting right atrial pressure of 8 mmHg.   6. A cystic structure is seen in the liver. Recommend dedicated imaging  if not already pursued   Comparison(s): No significant change from  prior study.  Echocardiogram 08/03/2021  IMPRESSIONS     1. Compared to 06/24/20, AS remains mild.   2. Left ventricular ejection fraction, by estimation, is 60 to 65%. The  left ventricle has normal function. The left  ventricle has no regional  wall motion abnormalities. Left ventricular diastolic parameters were  normal.   3. Right ventricular systolic function is normal. The right ventricular  size is normal.   4. The mitral valve is normal in structure. Mild mitral valve  regurgitation. No evidence of mitral stenosis.   5. The aortic valve is calcified. Aortic valve regurgitation is not  visualized. Mild aortic valve stenosis.   6. The inferior vena cava is normal in size with greater than 50%  respiratory variability, suggesting right atrial pressure of 3 mmHg.   FINDINGS   Left Ventricle: Left ventricular ejection fraction, by estimation, is 60  to 65%. The left ventricle has normal function. The left ventricle has no  regional wall motion abnormalities. The left ventricular internal cavity  size was normal in size. There is   no left ventricular hypertrophy. Left ventricular diastolic parameters  were normal.   Right Ventricle: The right ventricular size is normal. Right ventricular  systolic function is normal.   Left Atrium: Left atrial size was normal in size.   Right Atrium: Right atrial size was normal in size.   Pericardium: There is no evidence of pericardial effusion.   Mitral Valve: The mitral valve is normal in structure. Mild mitral annular  calcification. Mild mitral valve regurgitation. No evidence of mitral  valve stenosis.   Tricuspid Valve: The tricuspid valve is normal in structure. Tricuspid  valve regurgitation is trivial. No evidence of tricuspid stenosis.   Aortic Valve: The aortic valve is calcified. Aortic valve regurgitation is  not visualized. Mild aortic stenosis is present. Aortic valve mean  gradient measures 14.5 mmHg. Aortic valve peak  gradient measures 29.5  mmHg. Aortic valve area, by VTI measures  1.82 cm.   Pulmonic Valve: The pulmonic valve was normal in structure. Pulmonic valve  regurgitation is not visualized. No evidence of pulmonic stenosis.   Aorta: The aortic root is normal in size and structure.   Venous: The inferior vena cava is normal in size with greater than 50%  respiratory variability, suggesting right atrial pressure of 3 mmHg.   IAS/Shunts: The interatrial septum is aneurysmal. No atrial level shunt  detected by color flow Doppler.   Additional Comments: Compared to 06/24/20, AS remains mild.     Assessment & Plan   1.chest pain-Ed visit at [facility] on 05/09/2022.  His serial troponins were normal.  EKG was reassuring.  He followed up with Dr. Fuller Plan who increased his Protonix.  He is also planning to have endoscopy for evaluation of his hiatal hernia. Reassured that his symptoms were not related to his heart.  Essential hypertension-BP today 134/84.   Continue amlodipine, hydrochlorothiazide Heart healthy low-sodium diet Maintain physical activity   Fatigue/bilateral lower extremity weakness-stable.  Continues to be very physically active around his property .   Follow-up echocardiogram showed no significant changes from prior study.  Normal LVEF.   Maintain physical activity Maintain heart healthy diet   Dyspnea-stable.  Continues to be very physically active around his property/[place] acres.  Follow-up echocardiogram was reassuring.   Maintain current diet and physical activity  Cardiac murmur-3/6 unchanged/stable.  Echocardiogram 08/03/2021 showed stable LVEF and stable mild AS.  Details above.   Plan for repeat echocardiogram when clinically indicated      Disposition: Follow-up with Dr. Stanford Breed  in 9 months.   Jossie Ng. Ammi Hutt NP-C    06/17/2022, 10:42 AM [place] [address] Office [phone] Fax [phone]  Notice:  This dictation was prepared with Dragon dictation along with smaller phrase technology. Any transcriptional errors that result from this process are unintentional and may not be corrected upon review.  I spent 14 minutes examining this patient, reviewing medications, and using patient centered shared decision making involving her cardiac care.  Prior to her visit I spent greater than 20 minutes reviewing her past medical history,  medications, and prior cardiac tests.

## 2022-06-17 ENCOUNTER — Ambulatory Visit: Payer: Medicare Other | Attending: General Practice | Admitting: General Practice

## 2022-06-17 ENCOUNTER — Encounter: Payer: Self-pay | Admitting: General Practice

## 2022-06-17 VITALS — BP 134/84 | HR 81 | Ht 71.0 in | Wt 192.4 lb

## 2022-06-17 DIAGNOSIS — R011 Cardiac murmur, unspecified: Secondary | ICD-10-CM | POA: Diagnosis not present

## 2022-06-17 DIAGNOSIS — R29898 Other symptoms and signs involving the musculoskeletal system: Secondary | ICD-10-CM | POA: Diagnosis not present

## 2022-06-17 DIAGNOSIS — I10 Essential (primary) hypertension: Secondary | ICD-10-CM

## 2022-06-17 DIAGNOSIS — R0602 Shortness of breath: Secondary | ICD-10-CM

## 2022-06-17 DIAGNOSIS — R5383 Other fatigue: Secondary | ICD-10-CM | POA: Diagnosis not present

## 2022-06-17 NOTE — Patient Instructions (Signed)
Medication Instructions:  The current medical regimen is effective;  continue present plan and medications as directed. Please refer to the Current Medication list given to you today.  *If you need a refill on your cardiac medications before your next appointment, please call your pharmacy*  Lab Work: NONE If you have labs (blood work) drawn today and your tests are completely normal, you will receive your results only by: [place] (if you have MyChart) ORA paper copy in the mail If you have any lab test that is abnormal or we need to change your treatment, we will call you to review the results.  Testing/Procedures: NONE  Other Instructions OK TO RESUME YOUR ACTIVITIES FROM A CARDIAC STANDPOINT   Follow-Up: At [facility], you and your health needs are our priority.  As part of our continuing mission to provide you with exceptional heart care, we have created designated Provider Care Teams.  These Care Teams include your primary Cardiologist (physician) and Advanced Practice Providers (APPs -  Physician Assistants and Nurse Practitioners) who all work together to provide you with the care you need, when you need it.  Your next appointment:   9 month(s)  Provider:   Kirk Ruths, MD

## 2022-06-20 ENCOUNTER — Telehealth (HOSPITAL_COMMUNITY): Payer: Self-pay | Admitting: General Practice

## 2022-06-20 DIAGNOSIS — R0602 Shortness of breath: Secondary | ICD-10-CM

## 2022-06-20 DIAGNOSIS — R5383 Other fatigue: Secondary | ICD-10-CM

## 2022-06-20 DIAGNOSIS — R29898 Other symptoms and signs involving the musculoskeletal system: Secondary | ICD-10-CM

## 2022-06-20 NOTE — Telephone Encounter (Signed)
Called and explained to pt that he needs to get ECHO yearly. If he wants, look at last ECHO result and he can see the order to be repeated there. Pt states OK he will have it done. Order entered. Notified ECHO Scheduler

## 2022-06-20 NOTE — Telephone Encounter (Signed)
Per Coletta Memos, NP OV Note 3/1 Cardiac murmur-3/6 unchanged/stable.  Echocardiogram 08/03/2021 showed stable LVEF and stable mild AS.  Details above.   Plan for repeat echocardiogram when clinically indicated

## 2022-06-20 NOTE — Addendum Note (Signed)
Addended by: Waylan Rocher on: 06/20/2022 01:30 PM   Modules accepted: Orders

## 2022-06-20 NOTE — Telephone Encounter (Signed)
Order is in.

## 2022-06-20 NOTE — Telephone Encounter (Signed)
I called to schedule echocardiogram that was ordered by Coletta Memos on Friday, 06/17/22. Patient left office and was not scheduled. Patient states he will not schedule due to he was told at visit that he did not need any testing. Order will be removed from the echo WQ and if patient does need please call patient and make him aware and we will schedule and reinstate the order. Thank you.

## 2022-07-03 ENCOUNTER — Encounter: Payer: Self-pay | Admitting: Certified Registered Nurse Anesthetist

## 2022-07-05 ENCOUNTER — Encounter: Payer: Self-pay | Admitting: Gastroenterology

## 2022-07-05 ENCOUNTER — Ambulatory Visit (AMBULATORY_SURGERY_CENTER): Payer: Medicare Other | Admitting: Gastroenterology

## 2022-07-05 VITALS — BP 149/81 | HR 62 | Temp 98.7°F | Resp 14 | Ht 71.0 in | Wt 194.0 lb

## 2022-07-05 DIAGNOSIS — Z20822 Contact with and (suspected) exposure to covid-19: Secondary | ICD-10-CM | POA: Diagnosis not present

## 2022-07-05 DIAGNOSIS — K259 Gastric ulcer, unspecified as acute or chronic, without hemorrhage or perforation: Secondary | ICD-10-CM | POA: Diagnosis not present

## 2022-07-05 DIAGNOSIS — K449 Diaphragmatic hernia without obstruction or gangrene: Secondary | ICD-10-CM

## 2022-07-05 DIAGNOSIS — Z853 Personal history of malignant neoplasm of breast: Secondary | ICD-10-CM | POA: Diagnosis not present

## 2022-07-05 DIAGNOSIS — Z96653 Presence of artificial knee joint, bilateral: Secondary | ICD-10-CM | POA: Diagnosis not present

## 2022-07-05 DIAGNOSIS — K219 Gastro-esophageal reflux disease without esophagitis: Secondary | ICD-10-CM

## 2022-07-05 DIAGNOSIS — R079 Chest pain, unspecified: Secondary | ICD-10-CM

## 2022-07-05 DIAGNOSIS — K2270 Barrett's esophagus without dysplasia: Secondary | ICD-10-CM | POA: Diagnosis not present

## 2022-07-05 DIAGNOSIS — Z87891 Personal history of nicotine dependence: Secondary | ICD-10-CM | POA: Diagnosis not present

## 2022-07-05 DIAGNOSIS — Z981 Arthrodesis status: Secondary | ICD-10-CM | POA: Diagnosis not present

## 2022-07-05 DIAGNOSIS — K2970 Gastritis, unspecified, without bleeding: Secondary | ICD-10-CM

## 2022-07-05 DIAGNOSIS — K5790 Diverticulosis of intestine, part unspecified, without perforation or abscess without bleeding: Secondary | ICD-10-CM | POA: Diagnosis not present

## 2022-07-05 DIAGNOSIS — Z9884 Bariatric surgery status: Secondary | ICD-10-CM | POA: Diagnosis not present

## 2022-07-05 DIAGNOSIS — K2950 Unspecified chronic gastritis without bleeding: Secondary | ICD-10-CM | POA: Diagnosis not present

## 2022-07-05 DIAGNOSIS — I10 Essential (primary) hypertension: Secondary | ICD-10-CM | POA: Diagnosis not present

## 2022-07-05 DIAGNOSIS — K229 Disease of esophagus, unspecified: Secondary | ICD-10-CM

## 2022-07-05 MED ORDER — SODIUM CHLORIDE 0.9 % IV SOLN: 500.0000 mL | INTRAVENOUS | Status: DC

## 2022-07-05 NOTE — Progress Notes (Signed)
See 06/06/2022 H&P, no changes 

## 2022-07-05 NOTE — Progress Notes (Signed)
Called to room to assist during endoscopic procedure.  Patient ID and intended procedure confirmed with present staff. Received instructions for my participation in the procedure from the performing physician.  

## 2022-07-05 NOTE — Progress Notes (Signed)
Report given to PACU, vss 

## 2022-07-05 NOTE — Op Note (Signed)
[facility] Patient Name: Samuel [name] Procedure Date: 07/05/2022 9:24 AM MRN: [id number] Endoscopist: Ladene Artist , MD, [id number] Age: 82 Referring MD:  Date of Birth: 02/28/1941 Gender: Male Account #: [id number] Procedure:                Upper GI endoscopy Indications:              Gastroesophageal reflux disease, Unexplained chest                            pain Medicines:                Monitored Anesthesia Care Procedure:                Pre-Anesthesia Assessment:                           - Prior to the procedure, a History and Physical                            was performed, and patient medications and                            allergies were reviewed. The patient's tolerance of                            previous anesthesia was also reviewed. The risks                            and benefits of the procedure and the sedation                            options and risks were discussed with the patient.                            All questions were answered, and informed consent                            was obtained. Prior Anticoagulants: The patient has                            taken no anticoagulant or antiplatelet agents. ASA                            Grade Assessment: II - A patient with mild systemic                            disease. After reviewing the risks and benefits,                            the patient was deemed in satisfactory condition to                            undergo the procedure.  After obtaining informed consent, the endoscope was                            passed under direct vision. Throughout the                            procedure, the patient's blood pressure, pulse, and                            oxygen saturations were monitored continuously. The                            GIF [id number] was introduced through the                            mouth, and advanced to the second part of duodenum.                             The upper GI endoscopy was accomplished without                            difficulty. The patient tolerated the procedure                            well. Scope In: Scope Out: Findings:                 The Z-line was variable (1 cm) and was found at the                            gastroesophageal junction. Biopsies were taken with                            a cold forceps for histology.                           The exam of the esophagus was otherwise normal.                           Diffuse mild inflammation characterized by                            granularity was found in the gastric fundus and in                            the gastric body. Biopsies were taken with a cold                            forceps for histology.                           Diffuse mildly erythematous mucosa without bleeding                            was found in the  gastric antrum. Biopsies were                            taken with a cold forceps for histology.                           A medium-sized hiatal hernia was present.                           The duodenal bulb and second portion of the                            duodenum were normal. Complications:            No immediate complications. Estimated Blood Loss:     Estimated blood loss was minimal. Impression:               - Z-line variable, at the gastroesophageal                            junction. Biopsied.                           - Gastritis. Biopsied.                           - Erythematous mucosa in the antrum. Biopsied.                           - Medium-sized hiatal hernia.                           - Normal duodenal bulb and second portion of the                            duodenum. Recommendation:           - Patient has a contact number available for                            emergencies. The signs and symptoms of potential                            delayed complications were discussed with the                             patient. Return to normal activities tomorrow.                            Written discharge instructions were provided to the                            patient.                           - Resume previous diet.                           -  Follow antireflux measures long term.                           - Continue present medications.                           - Await pathology results. Ladene Artist, MD 07/05/2022 9:49:34 AM This report has been signed electronically.

## 2022-07-05 NOTE — Progress Notes (Signed)
0933 Robinul 0.1 mg IV given due large amount of secretions upon assessment.  MD made aware, vss  

## 2022-07-05 NOTE — Patient Instructions (Signed)
-  Handout on antireflux and gastritis provided -await pathology results -Continue present medications    YOU HAD AN ENDOSCOPIC PROCEDURE TODAY AT [facility]:   Refer to the procedure report that was given to you for any specific questions about what was found during the examination.  If the procedure report does not answer your questions, please call your gastroenterologist to clarify.  If you requested that your care partner not be given the details of your procedure findings, then the procedure report has been included in a sealed envelope for you to review at your convenience later.  YOU SHOULD EXPECT: Some feelings of bloating in the abdomen. Passage of more gas than usual.  Walking can help get rid of the air that was put into your GI tract during the procedure and reduce the bloating. If you had a lower endoscopy (such as a colonoscopy or flexible sigmoidoscopy) you may notice spotting of blood in your stool or on the toilet paper. If you underwent a bowel prep for your procedure, you may not have a normal bowel movement for a few days.  Please Note:  You might notice some irritation and congestion in your nose or some drainage.  This is from the oxygen used during your procedure.  There is no need for concern and it should clear up in a day or so.  Following upper endoscopy (EGD)  Vomiting of blood or coffee ground material  New chest pain or pain under the shoulder blades  Painful or persistently difficult swallowing  New shortness of breath  Fever of 100F or higher  Black, tarry-looking stools  For urgent or emergent issues, a gastroenterologist can be reached at any hour by calling [phone]. Do not use MyChart messaging for urgent concerns.    DIET:  We do recommend a small meal at first, but then you may proceed to your regular diet.  Drink plenty of fluids but you should avoid alcoholic beverages for 24 hours.  ACTIVITY:  You should plan to take it easy  for the rest of today and you should NOT DRIVE or use heavy machinery until tomorrow (because of the sedation medicines used during the test).    FOLLOW UP: Our staff will call the number listed on your records the next business day following your procedure.  We will call around 7:15- 8:00 am to check on you and address any questions or concerns that you may have regarding the information given to you following your procedure. If we do not reach you, we will leave a message.     If any biopsies were taken you will be contacted by phone or by letter within the next 1-3 weeks.  Please call us at [phone] if you have not heard about the biopsies in 3 weeks.    SIGNATURES/CONFIDENTIALITY: You and/or your care partner have signed paperwork which will be entered into your electronic medical record.  These signatures attest to the fact that that the information above on your After Visit Summary has been reviewed and is understood.  Full responsibility of the confidentiality of this discharge information lies with you and/or your care-partner.

## 2022-07-05 NOTE — Progress Notes (Signed)
Pt's states no medical or surgical changes since previsit or office visit. 

## 2022-07-06 ENCOUNTER — Telehealth: Payer: Self-pay

## 2022-07-06 NOTE — Telephone Encounter (Signed)
  Follow up Call-     07/05/2022    8:55 AM  Call back number  Post procedure Call Back phone  # [phone]  Permission to leave phone message Yes     Patient questions:  Do you have a fever, pain , or abdominal swelling? No. Pain Score  0 *  Have you tolerated food without any problems? Yes.    Have you been able to return to your normal activities? Yes.    Do you have any questions about your discharge instructions: Diet   No. Medications  No. Follow up visit  No.  Do you have questions or concerns about your Care? No.  Actions: * If pain score is 4 or above: No action needed, pain <4.

## 2022-07-13 DIAGNOSIS — J069 Acute upper respiratory infection, unspecified: Secondary | ICD-10-CM | POA: Diagnosis not present

## 2022-07-13 DIAGNOSIS — M9903 Segmental and somatic dysfunction of lumbar region: Secondary | ICD-10-CM | POA: Diagnosis not present

## 2022-07-18 DIAGNOSIS — G8929 Other chronic pain: Secondary | ICD-10-CM | POA: Diagnosis not present

## 2022-07-18 DIAGNOSIS — M25561 Pain in right knee: Secondary | ICD-10-CM | POA: Diagnosis not present

## 2022-07-26 ENCOUNTER — Encounter: Payer: Self-pay | Admitting: Gastroenterology

## 2022-07-28 ENCOUNTER — Telehealth: Payer: Self-pay | Admitting: Gastroenterology

## 2022-07-28 NOTE — Telephone Encounter (Signed)
Inbound call from patient, requesting to speak to a nurse to further advise on results from procedure 3/19.

## 2022-07-28 NOTE — Telephone Encounter (Signed)
The pt has been given the information in the letter and informed that a copy is in My Chart and mailed.  All questions answered to the best of my ability.   Dear Mr. Samuel [name],   I am writing to inform you that the biopsies taken during your recent endoscopic examination did not show any evidence of cancer upon pathologic examination.   However, your biopsies show you have a condition called Barrett's esophagus. While not cancer, it is precancerous (can progress to cancer) and should be monitored with repeat endoscopic examination and biopsies.   Fortunately, it is quite rare that this develops into cancer. Careful monitoring of the condition, along with taking your medication as prescribed, will probably reduce the chance of developing cancer.   The gastric biopsies showed chronic gastritis with intestinal metaplasia which is a benign irritation of the stomach lining.    I recommend that you consider having a repeat upper gastrointestinal endoscopic exam in 3 years.   Please call us [phone]  if you have or develop heartburn, reflux symptoms, any swallowing problems, or if you have questions about your condition that have not been fully answered at this time.     Sincerely,   Meryl Dare, MD              [facility]                                [place], [id number]                        1

## 2022-08-08 ENCOUNTER — Ambulatory Visit (HOSPITAL_COMMUNITY): Payer: Medicare Other | Attending: Internal Medicine

## 2022-08-08 DIAGNOSIS — R5383 Other fatigue: Secondary | ICD-10-CM | POA: Diagnosis not present

## 2022-08-08 DIAGNOSIS — R0602 Shortness of breath: Secondary | ICD-10-CM

## 2022-08-08 DIAGNOSIS — R29898 Other symptoms and signs involving the musculoskeletal system: Secondary | ICD-10-CM

## 2022-08-08 LAB — ECHOCARDIOGRAM COMPLETE
AR max vel: 1.68 cm2
AV Area VTI: 1.77 cm2
AV Area mean vel: 1.71 cm2
AV Mean grad: 18 mmHg
AV Peak grad: 32.7 mmHg
Ao pk vel: 2.86 m/s
Area-P 1/2: 2.4 cm2
S' Lateral: 3.3 cm

## 2022-08-09 ENCOUNTER — Other Ambulatory Visit: Payer: Self-pay

## 2022-08-09 DIAGNOSIS — R0602 Shortness of breath: Secondary | ICD-10-CM

## 2022-08-09 DIAGNOSIS — R29898 Other symptoms and signs involving the musculoskeletal system: Secondary | ICD-10-CM

## 2022-08-09 DIAGNOSIS — R5383 Other fatigue: Secondary | ICD-10-CM

## 2022-09-15 DIAGNOSIS — M9903 Segmental and somatic dysfunction of lumbar region: Secondary | ICD-10-CM | POA: Diagnosis not present

## 2022-11-29 DIAGNOSIS — J449 Chronic obstructive pulmonary disease, unspecified: Secondary | ICD-10-CM | POA: Diagnosis not present

## 2022-11-29 DIAGNOSIS — Z0000 Encounter for general adult medical examination without abnormal findings: Secondary | ICD-10-CM | POA: Diagnosis not present

## 2022-11-29 DIAGNOSIS — I700 Atherosclerosis of aorta: Secondary | ICD-10-CM | POA: Diagnosis not present

## 2022-11-29 DIAGNOSIS — I10 Essential (primary) hypertension: Secondary | ICD-10-CM | POA: Diagnosis not present

## 2022-11-29 DIAGNOSIS — K589 Irritable bowel syndrome without diarrhea: Secondary | ICD-10-CM | POA: Diagnosis not present

## 2022-12-31 ENCOUNTER — Other Ambulatory Visit: Payer: Self-pay | Admitting: Physician Assistant

## 2023-01-03 IMAGING — CT CT ABD-PELV W/ CM
2 of 5 series · 16 of 46 positions shown, 18 images · IV contrast (APPLIED)
Comparison: 02/13/2018

CLINICAL DATA: Abdominal pain and nausea.  Diverticulosis.

EXAM:
CT ABDOMEN AND PELVIS WITH CONTRAST
TECHNIQUE: Multidetector CT imaging of the abdomen and pelvis was performed
using the standard protocol following bolus administration of
intravenous contrast.

[Series 2: axial st · axial · 0.94mm/px · z∈[+1080,+1475]mm · 13 of 93 slices shown, 15 images]
[im 7/93  soft-tissue]
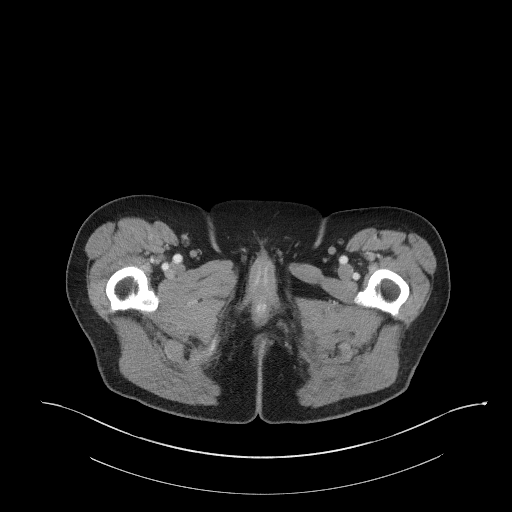
[im 7/93  bone]
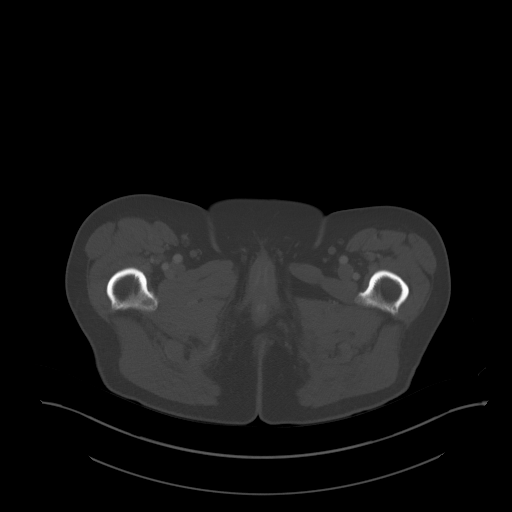
[im 14/93  soft-tissue]
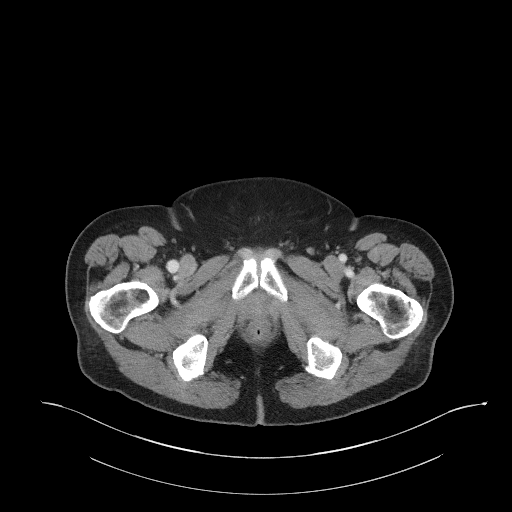
[im 20/93  soft-tissue]
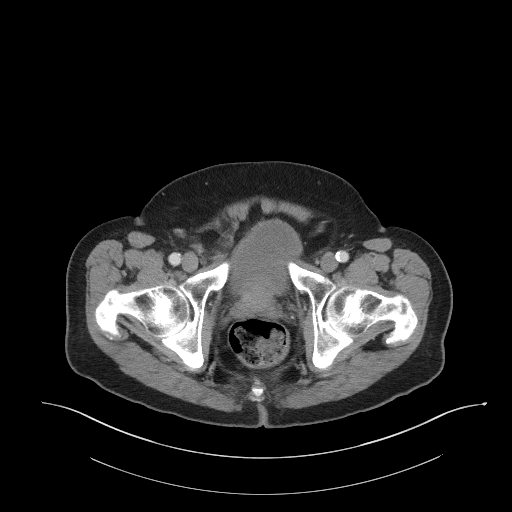
[im 27/93  soft-tissue]
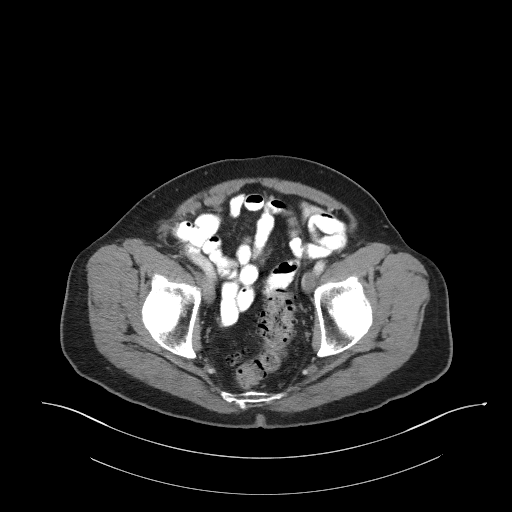
[im 33/93  soft-tissue]
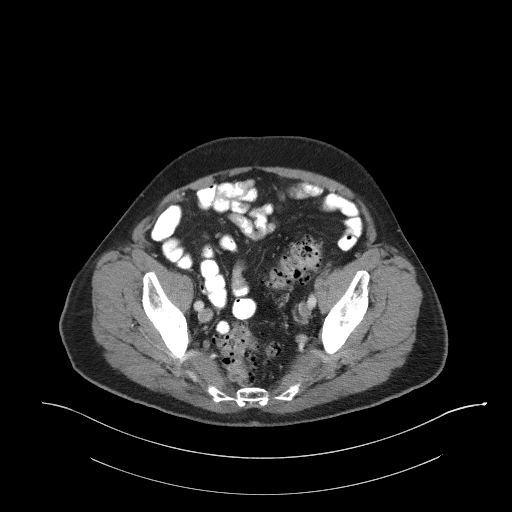
[im 40/93  soft-tissue]
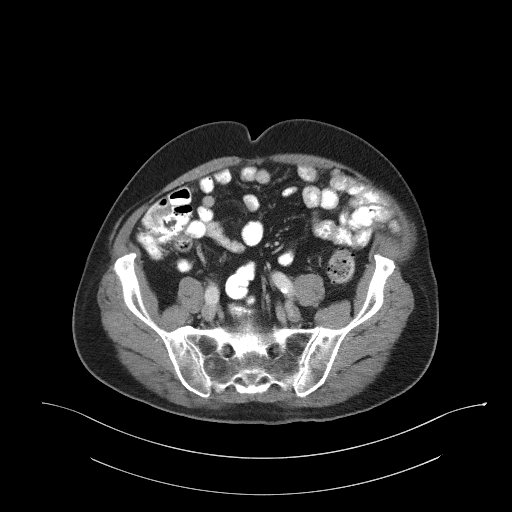
[im 47/93  soft-tissue]
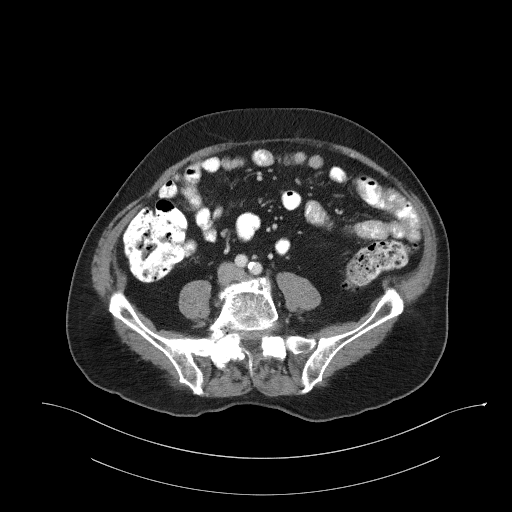
[im 53/93  soft-tissue]
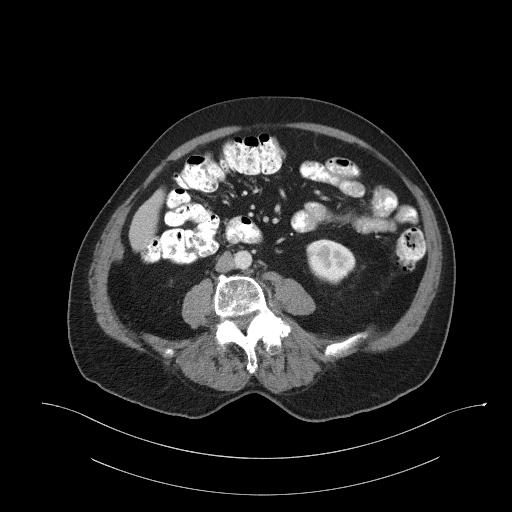
[im 60/93  soft-tissue]
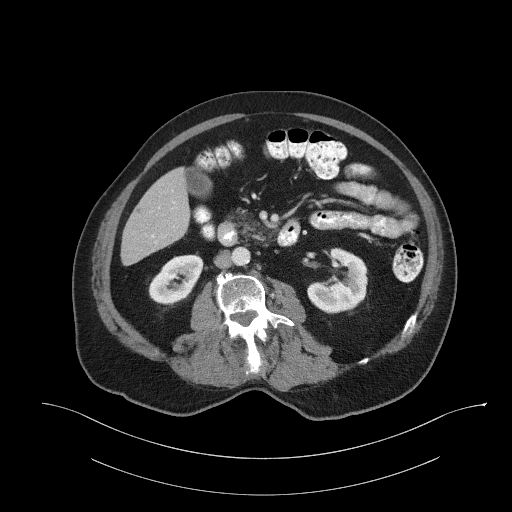
[im 60/93  bone]
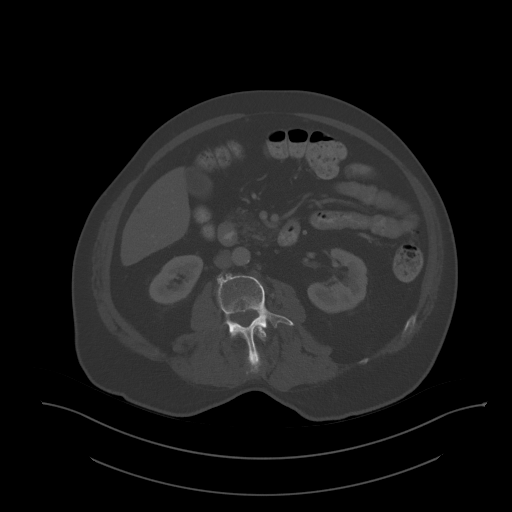
[im 66/93  soft-tissue]
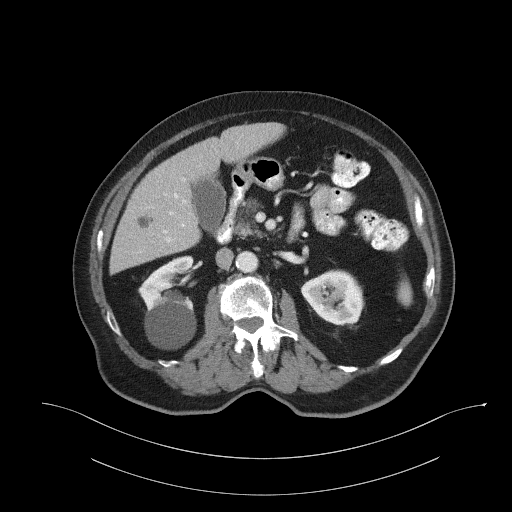
[im 73/93  soft-tissue]
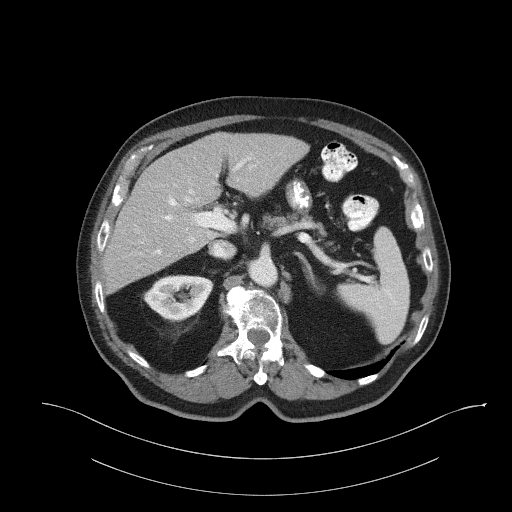
[im 79/93  soft-tissue]
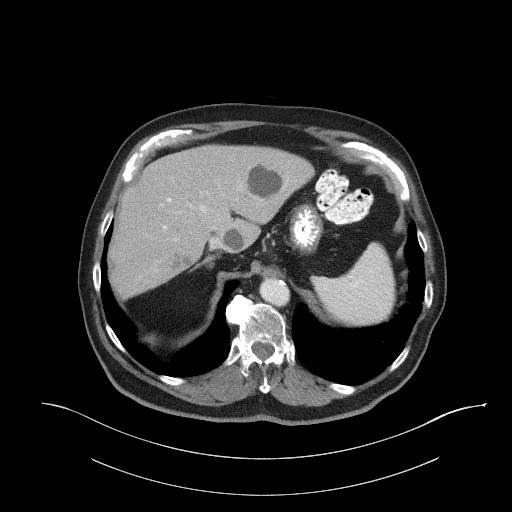
[im 86/93  soft-tissue]
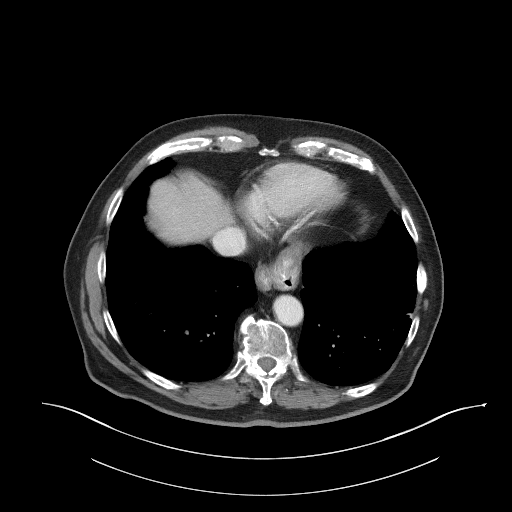

[Series 5: coronal st · coronal · 0.80mm/px · 3 of 116 slices shown]
[im 39/116  soft-tissue]
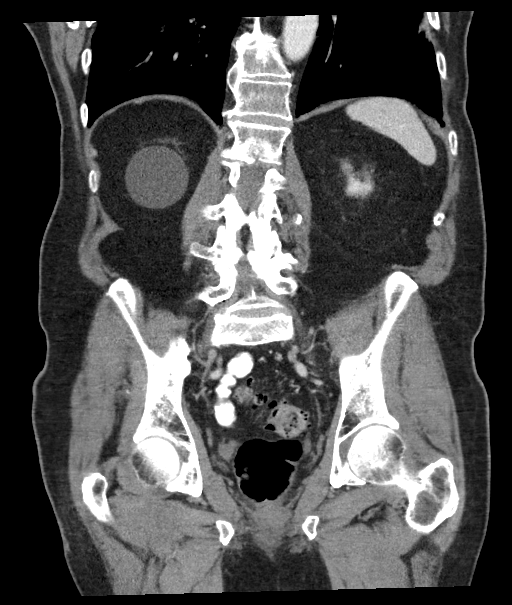
[im 52/116  soft-tissue]
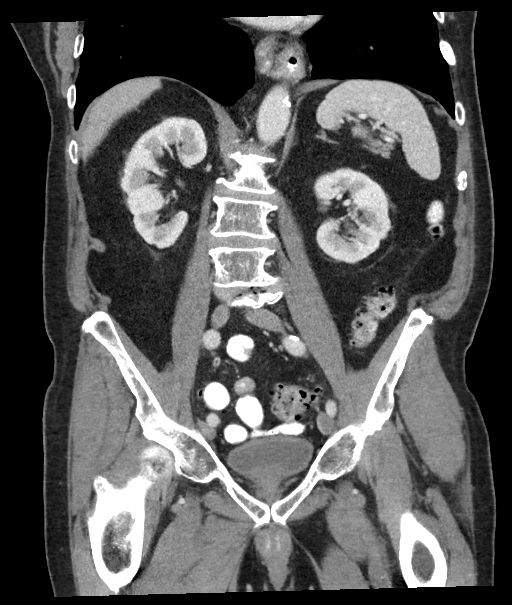
[im 64/116  soft-tissue]
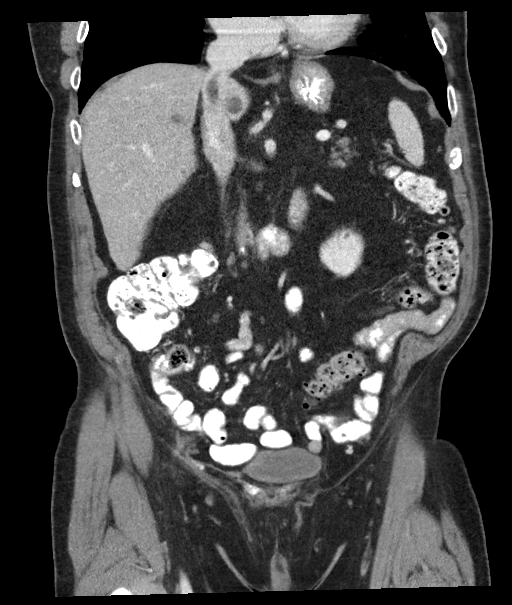

[16 of 46 positions shown; findings below may reference images not displayed]

RADIATION DOSE REDUCTION: This exam was performed according to the
departmental dose-optimization program which includes automated
exposure control, adjustment of the mA and/or kV according to
patient size and/or use of iterative reconstruction technique.

CONTRAST:  100mL OMNIPAQUE IOHEXOL 300 MG/ML  SOLN
FINDINGS: Lower Chest: No acute findings.

Hepatobiliary: No hepatic masses identified. Several benign hepatic
cysts are again noted. Gallbladder is unremarkable. No evidence of
biliary ductal dilatation.

Pancreas:  No mass or inflammatory changes.

Spleen: Within normal limits in size and appearance.

Adrenals/Urinary Tract: No masses identified. Benign right renal
cyst again noted (no followup imaging is recommended). No evidence
of ureteral calculi or hydronephrosis.

Stomach/Bowel: Stable small hiatal hernia. No evidence of
obstruction, inflammatory process or abnormal fluid collections.
Normal appendix visualized. Diverticulosis is seen mainly involving
the descending and sigmoid colon, however there is no evidence of
diverticulitis.

Vascular/Lymphatic: No pathologically enlarged lymph nodes. No acute
vascular findings. Aortic atherosclerotic calcification noted.

Reproductive:  No mass or other significant abnormality.

Other:  None.

Musculoskeletal: Displaced fractures of the left lateral 9th and
10th ribs are seen which are new since previous study and of
indeterminate age. No other suspicious bone lesions identified.
IMPRESSION: Colonic diverticulosis, without radiographic evidence of
diverticulitis.

Stable small hiatal hernia.

Displaced fractures of left lateral 9th and 10th ribs, new since
0202 exam but of indeterminate age.

Aortic Atherosclerosis (76P3B-[id number].Y).

## 2023-02-20 DIAGNOSIS — M9903 Segmental and somatic dysfunction of lumbar region: Secondary | ICD-10-CM | POA: Diagnosis not present

## 2023-02-27 DIAGNOSIS — M9903 Segmental and somatic dysfunction of lumbar region: Secondary | ICD-10-CM | POA: Diagnosis not present

## 2023-04-17 DIAGNOSIS — J069 Acute upper respiratory infection, unspecified: Secondary | ICD-10-CM | POA: Diagnosis not present

## 2023-04-18 DIAGNOSIS — H524 Presbyopia: Secondary | ICD-10-CM | POA: Diagnosis not present

## 2023-04-18 DIAGNOSIS — H2513 Age-related nuclear cataract, bilateral: Secondary | ICD-10-CM | POA: Diagnosis not present

## 2023-04-18 DIAGNOSIS — H353132 Nonexudative age-related macular degeneration, bilateral, intermediate dry stage: Secondary | ICD-10-CM | POA: Diagnosis not present

## 2023-04-24 DIAGNOSIS — M9903 Segmental and somatic dysfunction of lumbar region: Secondary | ICD-10-CM | POA: Diagnosis not present

## 2023-04-26 DIAGNOSIS — M9903 Segmental and somatic dysfunction of lumbar region: Secondary | ICD-10-CM | POA: Diagnosis not present

## 2023-05-23 DIAGNOSIS — M9903 Segmental and somatic dysfunction of lumbar region: Secondary | ICD-10-CM | POA: Diagnosis not present

## 2023-06-20 DIAGNOSIS — M9903 Segmental and somatic dysfunction of lumbar region: Secondary | ICD-10-CM | POA: Diagnosis not present

## 2023-07-05 DIAGNOSIS — R29898 Other symptoms and signs involving the musculoskeletal system: Secondary | ICD-10-CM | POA: Diagnosis not present

## 2023-07-11 DIAGNOSIS — M9903 Segmental and somatic dysfunction of lumbar region: Secondary | ICD-10-CM | POA: Diagnosis not present

## 2023-07-18 ENCOUNTER — Other Ambulatory Visit (HOSPITAL_COMMUNITY)

## 2023-07-25 ENCOUNTER — Ambulatory Visit (HOSPITAL_COMMUNITY): Attending: General Practice

## 2023-07-25 ENCOUNTER — Telehealth: Payer: Self-pay | Admitting: Cardiology

## 2023-07-25 DIAGNOSIS — R29898 Other symptoms and signs involving the musculoskeletal system: Secondary | ICD-10-CM | POA: Diagnosis not present

## 2023-07-25 DIAGNOSIS — R5383 Other fatigue: Secondary | ICD-10-CM | POA: Diagnosis not present

## 2023-07-25 DIAGNOSIS — I350 Nonrheumatic aortic (valve) stenosis: Secondary | ICD-10-CM | POA: Diagnosis not present

## 2023-07-25 DIAGNOSIS — R0602 Shortness of breath: Secondary | ICD-10-CM | POA: Insufficient documentation

## 2023-07-25 LAB — ECHOCARDIOGRAM COMPLETE
AR max vel: 1.56 cm2
AV Area VTI: 1.47 cm2
AV Area mean vel: 1.51 cm2
AV Mean grad: 16.3 mmHg
AV Peak grad: 30.8 mmHg
Ao pk vel: 2.77 m/s
Area-P 1/2: 3.37 cm2
S' Lateral: 3.1 cm

## 2023-07-25 NOTE — Telephone Encounter (Signed)
 Called and spoke to patient and ECHO results seen by patient. Patient request to schedule a  visit with provider to discuss  ECHO results. Patient request to have visit scheduled. Made patient aware that we will make provider aware. Understanding verbalized.

## 2023-07-25 NOTE — Telephone Encounter (Signed)
 Patient calling in to go over his results. Please advise

## 2023-07-27 NOTE — Telephone Encounter (Signed)
 Called and scheduled an appt to discuss results notified this is be at our new [place] site

## 2023-08-08 DIAGNOSIS — M9903 Segmental and somatic dysfunction of lumbar region: Secondary | ICD-10-CM | POA: Diagnosis not present

## 2023-08-10 DIAGNOSIS — R350 Frequency of micturition: Secondary | ICD-10-CM | POA: Diagnosis not present

## 2023-08-16 NOTE — Progress Notes (Signed)
 Cardiology Clinic Note   Patient Name: Samuel [name] Date of Encounter: 08/17/2023  Primary Care Provider:  Wyn Heater, MD Primary Cardiologist:  Alexandria Angel, MD  Patient Profile    Samuel [name] 83 year old male presents the clinic today for follow-up evaluation of his HTN and chest discomfort.  Past Medical History    Past Medical History:  Diagnosis Date   Arthritis    Diverticulosis    GERD (gastroesophageal reflux disease)    Heart murmur    Hemorrhoids    Hiatal hernia    Hypertension    Tubular adenoma of colon 01/2009   Past Surgical History:  Procedure Laterality Date   COLONOSCOPY     HERNIA REPAIR     POLYPECTOMY     REPLACEMENT TOTAL KNEE  02/14/11   left knee    Allergies  Allergies  Allergen Reactions   Codeine     REACTION: Agitation    History of Present Illness    [name] has a PMH of hypertension, diverticulosis, GERD, Barrett's esophagus, and chest discomfort.  His PMH also includescardiac murmur, dyspnea, and lung nodule.   He was initially referred by his PCP Darcey Earthly.  He was seen in initial evaluation 7/17.  His echocardiogram 6/17 showed normal LV function, severe left atrial enlargement, and mild right atrial enlargement.  A nuclear stress test 7/17 showed an EF of 57%, no ischemia and no infarction.  An abdominal CT 10/19 showed aortic atherosclerosis but no aneurysm.  A 6 mm nodule in the right upper lobe and follow-up imaging was recommended for 6-12 months.  He was last seen by Dr. Audery Blazing on 05/07/2019.  During that time he denied orthopnea, PND, lower extremity swelling, exertional chest pain, and syncope.   His follow-up CT showed a 4 mm right lower lobe nodule unchanged from previous scan.  The nodule was felt to be benign.   He presented the clinic 06/04/2020 for follow-up evaluation stated he occasionally noticed chest discomfort in the evening after eating a big meal.  He reported he presented to his PCP who  prescribed Pepcid along with his Protonix .  He felt that it made some difference.  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  He also reported that he was able to walk up and down 2 flights of stairs at church.  He denied chest discomfort with the activities.    He reported that he regularly drank beet juice which had been helping with his blood pressure at home.  It had been ranging in the 120s-130s over 70s.  I ordered a echocardiogram for further evaluation, continued his current medications, requested his lab work from his PCP, gave salty 6 diet sheet, and planned follow-up in a year.  His echocardiogram 06/24/2020 showed an LVEF of 60-65% moderate LVH and G2 DD.  He presentd to the clinic 07/19/21 for follow-up evaluation stated he had noticed leg weakness for the last 4-5 months.  He reported that he first noted leg weakness 07/22/2019 after receiving the [employer] COVID shot.  His symptoms progressively improved.  He felt he had returned to normal and was doing all of his normal activities and work around his property.  However, over the last 4-5 months he has noticed consistent weakness.  He reported that he noticed weakness even when he woke up in the morning.  He denied claudication.  He felt that  his breathing was not quite as good as it was.  He had extensive lab work drawn at his PCP which I  requested for review.  I ordered an echocardiogram, asked him increase his physical activity , continue his diet and follow-up after echocardiogram.  His echocardiogram 08/03/2021 showed normal LVEF mild mitral valve regurgitation and no other significant valvular abnormalities.  He presented to the clinic 09/20/2021 for follow-up evaluation and stated his lower extremity weakness was unchanged.  He continued to be very physically active around his property.  He reportd that he did subdivided his 130 acres but kept 37 acres.  He was thinking about  buying a new [employer] 0 turn mower.  He continues to make Hay, and keep beef cattle.  We reviewed his echocardiogram and he expressed understanding.  I reassured him and his wife that it is weakness and was not related to cardiac issues.  I planned follow-up in 9 to 12 months with Dr. Audery Blazing.   06/17/22-For follow-up evaluation and stated he was  in the emergency department 05/09/2022.  We reviewed his emergency department visit and his testing.  He and his wife expressed understanding.  He had since followed up with GI who recommend endoscopy.  His Protonix  was doubled and he was also taking Prilosec.  He had significant reduction of his epigastric symptoms.   Follow-up in 9 months was planned.  He presents to the clinic today for follow-up evaluation and states he feels well.  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  He was recently driving fence post.  He feels that this hurt his back.  It is slowly getting better.  We reviewed his recent echocardiogram.  He and his wife expressed understanding.  His EKG today shows atrial fibrillation/flutter with PACs 70 bpm.  Case was discussed with DOD.  I will prescribe apixaban  5 mg twice daily and have him follow-up in 4 to 6 weeks.  We will plan for DCCV.  Today he denies chest pain, shortness of breath, lower extremity edema, fatigue, palpitations, melena, hematuria, hemoptysis, diaphoresis, weakness, presyncope, syncope, orthopnea, and PND.   Home Medications    Prior to Admission medications   Medication Sig Start Date End Date Taking? Authorizing Provider  amLODipine (NORVASC) 5 MG tablet Take 5 mg by mouth daily.    [provider]  aspirin  81 MG tablet Take 81 mg by mouth daily.    [provider]  Cholecalciferol (VITAMIN D3) 2000 UNITS TABS Take 1 tablet by mouth daily.     [provider]  clidinium-chlordiazePOXIDE  (LIBRAX) 5-2.5 MG capsule Take 1 capsule by mouth 3 (three) times daily as needed. 04/29/21   Asencion Blacksmith, MD  glycopyrrolate  (ROBINUL ) 2 MG tablet TAKE ONE TABLET BY MOUTH TWICE DAILY 01/25/21   Esterwood, Amy S, PA-C  hydrochlorothiazide (HYDRODIURIL) 25 MG tablet Take 25 mg by mouth daily.    [provider]  pantoprazole  (PROTONIX ) 40 MG tablet TAKE ONE TABLET BY MOUTH TWICE DAILY 07/05/21   Asencion Blacksmith, MD  Turmeric 500 MG TABS Take 1 tablet by mouth daily.    [provider]  vitamin E 180 MG (400 UNITS) capsule Take 400 Units by mouth daily.    [provider]  Zinc 50 MG TABS Take 1 tablet by mouth daily.    [provider]    Family History    Family History  Problem Relation Age of Onset   Breast cancer Sister  Pancreatic cancer Brother    Colon cancer Neg Hx    Esophageal cancer Neg Hx    Rectal cancer Neg Hx    Stomach cancer Neg Hx    He indicated that his mother is deceased. He indicated that his father is deceased. He indicated that his sister is deceased. He indicated that his brother is deceased. He indicated that the status of his neg hx is unknown.  Social History    Social History   Socioeconomic History   Marital status: Married    Spouse name: Not on file   Number of children: 4   Years of education: Not on file   Highest education level: Not on file  Occupational History   Not on file  Tobacco Use   Smoking status: Former    Current packs/day: 0.00    Types: Cigarettes    Quit date: 02/21/1964    Years since quitting: 59.5   Smokeless tobacco: Never  Vaping Use   Vaping status: Never Used  Substance and Sexual Activity   Alcohol use: No   Drug use: No   Sexual activity: Not on file  Other Topics Concern   Not on file  Social History Narrative   Not on file   Social Drivers of Health   Financial Resource Strain: Not on file  Food Insecurity: Not on file  Transportation Needs: Not on file  Physical Activity: Not on file  Stress: Not on file  Social Connections: Not on file  Intimate Partner  Violence: Not on file     Review of Systems    General:  No chills, fever, night sweats or weight changes.  Cardiovascular:  No chest pain, dyspnea on exertion, edema, orthopnea, palpitations, paroxysmal nocturnal dyspnea. Dermatological: No rash, lesions/masses Respiratory: No cough, dyspnea Urologic: No hematuria, dysuria Abdominal:   No nausea, vomiting, diarrhea, bright red blood per rectum, melena, or hematemesis Neurologic:  No visual changes, wkns, changes in mental status. All other systems reviewed and are otherwise negative except as noted above.  Physical Exam    VS:  BP 136/62   Pulse 68   Ht 5\' 11"  (1.803 m)   Wt 192 lb 9.6 oz (87.4 kg)   SpO2 97%   BMI 26.86 kg/m  , BMI Body mass index is 26.86 kg/m. GEN: Well nourished, well developed, in no acute distress. HEENT: normal. Neck: Supple, no JVD, carotid bruits, or masses. Cardiac: RRR, 2/6 systolic murmur heard best along left sternal border, rubs, or gallops. No clubbing, cyanosis, edema.  Radials/DP/PT 2+ and equal bilaterally.  Respiratory:  Respirations regular and unlabored, clear to auscultation bilaterally. GI: Soft, nontender, nondistended, BS + x 4. MS: no deformity or atrophy. Skin: warm and dry, no rash. Neuro:  Strength and sensation are intact. Psych: Normal affect.  Accessory Clinical Findings    Recent Labs: No results found for requested labs within last 365 days.   Recent Lipid Panel No results found for: "CHOL", "TRIG", "HDL", "CHOLHDL", "VLDL", "LDLCALC", "LDLDIRECT"  ECG personally reviewed by me today-EKG Interpretation Date/Time:  Thursday Aug 17 2023 14:04:18 EDT Ventricular Rate:  70 PR Interval:    QRS Duration:  96 QT Interval:  382 QTC Calculation: 412 R Axis:   49  Text Interpretation: Atrial fibrillation with premature ventricular or aberrantly conducted complexes When compared with ECG of 17-Aug-2023 14:02, No significant change was found Confirmed by Lawana Pray  [phone]) on 08/17/2023 2:06:58 PM   EKG 09/20/2021 sinus rhythm with first-degree AV block  with frequent and consecutive premature ventricular complexes 87 bpm- No acute changes  EKG 06/04/2020 sinus rhythm with marked sinus arrhythmia with first-degree block 72 bpm- No acute changes   Chest CT without contrast 05/15/2019 COMPARISON:  CT abdomen pelvis 02/13/2018 and CT chest 09/16/2008.   FINDINGS: Cardiovascular: Atherosclerotic calcification of the aorta, aortic valve and coronary arteries. Heart size normal. No pericardial effusion.   Mediastinum/Nodes: No pathologically enlarged mediastinal or axillary lymph nodes. Hilar regions are difficult to definitively evaluate without IV contrast but appear grossly unremarkable. Esophagus is somewhat dilated and fluid-filled.   Lungs/Pleura: Biapical pleuroparenchymal scarring. Centrilobular and paraseptal emphysema. Mild scarring in the peripheral lower lobes. 4 mm right lower lobe nodule (8/78), unchanged from 09/16/2008. Calcified granulomas. No pleural fluid. Airway is unremarkable.   Upper Abdomen: Low-attenuation lesions in the liver measure up to 3.1 cm in the left hepatic lobe, similar and likely cysts. Visualized portions of the liver, gallbladder and adrenal glands are otherwise unremarkable. 4.7 cm low-attenuation lesion off the posterior right kidney is likely a cyst. Visualized portions of the kidneys, spleen, pancreas, stomach and bowel are otherwise unremarkable with the exception of a moderate hiatal hernia. Soft tissue lesion along the periphery of the right pararenal space (2/143), unchanged and likely a benign pseudotumor.   Musculoskeletal: Degenerative changes in the spine. No worrisome lytic or sclerotic lesions. Flowing anterior osteophytosis in the thoracic spine.   IMPRESSION: 1. Right lower lobe nodule is unchanged from 09/16/2008 and is considered benign. No worrisome pulmonary nodules. 2. Esophagus is  mildly dilated and fluid-filled, indicating dysmotility. 3. Moderate hiatal hernia. 4. Aortic atherosclerosis (ICD10-I70.0). Coronary artery calcification. 5.  Emphysema (ICD10-J43.9).   Echocardiogram 05/17/2019 IMPRESSIONS     1. Left ventricular ejection fraction, by visual estimation, is 60 to  65%. The left ventricle has normal function. There is moderately increased  left ventricular hypertrophy.   2. The left ventricle has no regional wall motion abnormalities.   3. Global right ventricle has normal systolic function.The right  ventricular size is normal. No increase in right ventricular wall  thickness.   4. Left atrial size was normal.   5. Right atrial size was normal.   6. Trivial pericardial effusion is present.   7. Cystic appearing structure in the liver. Recommend dedicated liver  imaging if clinically indicated.   8. The mitral valve is normal in structure. Trivial mitral valve  regurgitation.   9. The tricuspid valve is normal in structure.  10. The tricuspid valve is normal in structure. Tricuspid valve  regurgitation is trivial.  11. The aortic valve is tricuspid. Aortic valve regurgitation is not  visualized. Mild aortic valve stenosis.  12. The pulmonic valve was grossly normal. Pulmonic valve regurgitation is  trivial.  13. Normal pulmonary artery systolic pressure.  14. The inferior vena cava is normal in size with greater than 50%  respiratory variability, suggesting right atrial pressure of 3 mmHg.   Echocardiogram 06/24/2020 IMPRESSIONS     1. Left ventricular ejection fraction, by estimation, is 60 to 65%. The  left ventricle has normal function. The left ventricle has no regional  wall motion abnormalities. There is moderate asymmetric left ventricular  hypertrophy of the basal-septal  segment. Left ventricular diastolic parameters are consistent with Grade  II diastolic dysfunction (pseudonormalization).   2. Right ventricular systolic function  is normal. The right ventricular  size is normal.   3. The mitral valve is grossly normal. Trivial mitral valve  regurgitation.   4. The aortic  valve is tricuspid. There is moderate calcification of the  aortic valve. There is moderate thickening of the aortic valve. Aortic  valve regurgitation is not visualized. Mild aortic valve stenosis. AVA by  continuity equation is 1.4cm2, mean   gradient , peak gradient , Vmax 2.5 m/s, DI 0.37.   5. The inferior vena cava is normal in size with <50% respiratory  variability, suggesting right atrial pressure of 8 mmHg.   6. A cystic structure is seen in the liver. Recommend dedicated imaging  if not already pursued   Comparison(s): No significant change from prior study.  Echocardiogram 08/03/2021  IMPRESSIONS     1. Compared to 06/24/20, AS remains mild.   2. Left ventricular ejection fraction, by estimation, is 60 to 65%. The  left ventricle has normal function. The left ventricle has no regional  wall motion abnormalities. Left ventricular diastolic parameters were  normal.   3. Right ventricular systolic function is normal. The right ventricular  size is normal.   4. The mitral valve is normal in structure. Mild mitral valve  regurgitation. No evidence of mitral stenosis.   5. The aortic valve is calcified. Aortic valve regurgitation is not  visualized. Mild aortic valve stenosis.   6. The inferior vena cava is normal in size with greater than 50%  respiratory variability, suggesting right atrial pressure of 3 mmHg.   FINDINGS   Left Ventricle: Left ventricular ejection fraction, by estimation, is 60  to 65%. The left ventricle has normal function. The left ventricle has no  regional wall motion abnormalities. The left ventricular internal cavity  size was normal in size. There is   no left ventricular hypertrophy. Left ventricular diastolic parameters  were normal.   Right Ventricle: The right ventricular size is normal.  Right ventricular  systolic function is normal.   Left Atrium: Left atrial size was normal in size.   Right Atrium: Right atrial size was normal in size.   Pericardium: There is no evidence of pericardial effusion.   Mitral Valve: The mitral valve is normal in structure. Mild mitral annular  calcification. Mild mitral valve regurgitation. No evidence of mitral  valve stenosis.   Tricuspid Valve: The tricuspid valve is normal in structure. Tricuspid  valve regurgitation is trivial. No evidence of tricuspid stenosis.   Aortic Valve: The aortic valve is calcified. Aortic valve regurgitation is  not visualized. Mild aortic stenosis is present. Aortic valve mean  gradient measures 14.5 mmHg. Aortic valve peak gradient measures 29.5  mmHg. Aortic valve area, by VTI measures  1.82 cm.   Pulmonic Valve: The pulmonic valve was normal in structure. Pulmonic valve  regurgitation is not visualized. No evidence of pulmonic stenosis.   Aorta: The aortic root is normal in size and structure.   Venous: The inferior vena cava is normal in size with greater than 50%  respiratory variability, suggesting right atrial pressure of 3 mmHg.   IAS/Shunts: The interatrial septum is aneurysmal. No atrial level shunt  detected by color flow Doppler.   Additional Comments: Compared to 06/24/20, AS remains mild.    Echo 07/25/23  IMPRESSIONS     1. Left ventricular ejection fraction, by estimation, is 55 to 60%. The  left ventricle has normal function. The left ventricle has no regional  wall motion abnormalities. Left ventricular diastolic parameters were  normal.   2. Right ventricular systolic function is mildly reduced. The right  ventricular size is mildly enlarged. There is normal pulmonary artery  systolic pressure.  3. Left atrial size was moderately dilated.   4. Color flow near septum on limited views suggest f/u bubble study  Cystic liver disease noted . sub optimal.   5. Right atrial  size was moderately dilated.   6. The mitral valve is abnormal. Mild mitral valve regurgitation. No  evidence of mitral stenosis.   7. Prior gradients on TTE 08/08/22 mean 18 peak 33 mmHg stable moderate AS  . The aortic valve is tricuspid. There is moderate calcification of the  aortic valve. There is moderate thickening of the aortic valve. Aortic  valve regurgitation is trivial.  Moderate aortic valve stenosis.   8. The inferior vena cava is normal in size with greater than 50%  respiratory variability, suggesting right atrial pressure of 3 mmHg.   FINDINGS   Left Ventricle: Left ventricular ejection fraction, by estimation, is 55  to 60%. The left ventricle has normal function. The left ventricle has no  regional wall motion abnormalities. Strain was performed and the global  longitudinal strain is  indeterminate. The left ventricular internal cavity size was normal in  size. There is no left ventricular hypertrophy. Left ventricular diastolic  parameters were normal.   Right Ventricle: The right ventricular size is mildly enlarged. No  increase in right ventricular wall thickness. Right ventricular systolic  function is mildly reduced. There is normal pulmonary artery systolic  pressure. The tricuspid regurgitant velocity   is 2.37 m/s, and with an assumed right atrial pressure of 8 mmHg, the  estimated right ventricular systolic pressure is 30.5 mmHg.   Left Atrium: Left atrial size was moderately dilated.   Right Atrium: Right atrial size was moderately dilated.   Pericardium: There is no evidence of pericardial effusion.   Mitral Valve: The mitral valve is abnormal. There is mild thickening of  the mitral valve leaflet(s). There is mild calcification of the mitral  valve leaflet(s). Mild mitral valve regurgitation. No evidence of mitral  valve stenosis.   Tricuspid Valve: The tricuspid valve is normal in structure. Tricuspid  valve regurgitation is mild . No evidence of  tricuspid stenosis.   Aortic Valve: Prior gradients on TTE 08/08/22 mean 18 peak 33 mmHg stable  moderate AS. The aortic valve is tricuspid. There is moderate  calcification of the aortic valve. There is moderate thickening of the  aortic valve. Aortic valve regurgitation is  trivial. Moderate aortic stenosis is present. Aortic valve mean gradient  measures 16.3 mmHg. Aortic valve peak gradient measures 30.8 mmHg. Aortic  valve area, by VTI measures 1.47 cm.   Pulmonic Valve: The pulmonic valve was normal in structure. Pulmonic valve  regurgitation is not visualized. No evidence of pulmonic stenosis.   Aorta: The aortic root is normal in size and structure.   Venous: The inferior vena cava is normal in size with greater than 50%  respiratory variability, suggesting right atrial pressure of 3 mmHg.   IAS/Shunts: Sub optimal.   Assessment & Plan   1.Atrial fib/flutter- Heart rate today 70 bpm. Cardiac unaware. No increased DOE. Breathing stable. Echo reassuring. Start eliquis  5mg  BID Avoid triggers-reviewed Increase po hydration Plan for DCCV in 3-4 weeks  Cardiac murmur-3/6 systolic murmur heard along right sternal border echocardiogram 07/25/23 showed stable LVEF and stable mild AS.  Details above.   Plan for repeat echocardiogram when clinically indicated  Essential hypertension-BP today 136/62.   Continue amlodipine, hydrochlorothiazide Heart healthy low-sodium diet-reviewed Maintain physical activity   Fatigue/bilateral lower extremity weakness-unchanged.  Continues with physical active around  his property .     Maintain physical activity Maintain heart healthy diet   Dyspnea-stable.  Continues to be very physically active around his property/[place] acres.  Echocardiogram 07/25/2023 showed LVEF of 55-60%, mild mitral valve regurgitation and stable moderate AAS with mean gradient of 18 and peak gradient of 33 mmHg. Maintain current diet and physical activity   Chest pain-denies  recent episodes of chest discomfort and exertional type pain.  He was seen in the emergency department at [facility] on 05/09/2022.  His serial troponins were normal.  EKG was reassuring.  He followed up with Dr. Sandrea Cruel who increased his Protonix .   Endoscopy was planned for evaluation of his hiatal hernia. No plans for ischemic evaluation at this time   Disposition: Follow-up with Dr. Audery Blazing  or me in 4-6 weeks   Chet Cota. Skya Mccullum NP-C    08/17/2023, 2:54 PM [facility] [address] Office [phone] Fax [phone]  Notice: This dictation was prepared with Dragon dictation along with smaller phrase technology. Any transcriptional errors that result from this process are unintentional and may not be corrected upon review.  I spent 15 minutes examining this patient, reviewing medications, and using patient centered shared decision making involving her cardiac care.   I spent  20 minutes reviewing  past medical history,  medications, and prior cardiac tests.

## 2023-08-17 ENCOUNTER — Encounter: Payer: Self-pay | Admitting: General Practice

## 2023-08-17 ENCOUNTER — Ambulatory Visit: Attending: General Practice | Admitting: General Practice

## 2023-08-17 VITALS — BP 136/62 | HR 68 | Ht 71.0 in | Wt 192.6 lb

## 2023-08-17 DIAGNOSIS — I4891 Unspecified atrial fibrillation: Secondary | ICD-10-CM

## 2023-08-17 DIAGNOSIS — I10 Essential (primary) hypertension: Secondary | ICD-10-CM

## 2023-08-17 DIAGNOSIS — R29898 Other symptoms and signs involving the musculoskeletal system: Secondary | ICD-10-CM | POA: Diagnosis not present

## 2023-08-17 DIAGNOSIS — R5383 Other fatigue: Secondary | ICD-10-CM | POA: Diagnosis not present

## 2023-08-17 DIAGNOSIS — R0602 Shortness of breath: Secondary | ICD-10-CM | POA: Diagnosis not present

## 2023-08-17 DIAGNOSIS — R011 Cardiac murmur, unspecified: Secondary | ICD-10-CM

## 2023-08-17 MED ORDER — APIXABAN 5 MG PO TABS: 5.0000 mg | ORAL_TABLET | Freq: Two times a day (BID) | ORAL | 3 refills | Status: DC

## 2023-08-17 NOTE — Patient Instructions (Signed)
 Medication Instructions:  START APIXABAN (ELIQUIS ) 5MG  TWICE DAILY *If you need a refill on your cardiac medications before your next appointment, please call your pharmacy*  Lab Work: MAG, CBC AND BMET TODAY If you have labs (blood work) drawn today and your tests are completely normal, you will receive your results only by:  MyChart Message (if you have MyChart) OR  A paper copy in the mail If you have any lab test that is abnormal or we need to change your treatment, we will call you to review the results.  Other Instructions INCREASE HYDRATION  Please try to avoid these triggers: Do not use any products that have nicotine or tobacco in them. These include cigarettes, e-cigarettes, and chewing tobacco. If you need help quitting, ask your doctor. Eat heart-healthy foods. Talk with your doctor about the right eating plan for you. Exercise regularly as told by your doctor. Stay hydrated Do not drink alcohol, Caffeine or chocolate. Lose weight if you are overweight. Do not use drugs, including cannabis  Follow-Up: At [facility], you and your health needs are our priority.  As part of our continuing mission to provide you with exceptional heart care, our providers are all part of one team.  This team includes your primary Cardiologist (physician) and Advanced Practice Providers or APPs (Physician Assistants and Nurse Practitioners) who all work together to provide you with the care you need, when you need it.  Your next appointment:   4 week(s)  Provider:   Lawana Pray, NP

## 2023-08-18 ENCOUNTER — Telehealth: Payer: Self-pay | Admitting: Gastroenterology

## 2023-08-18 LAB — BASIC METABOLIC PANEL WITH GFR
BUN/Creatinine Ratio: 16 (ref 10–24)
BUN: 16 mg/dL (ref 8–27)
CO2: 23 mmol/L (ref 20–29)
Calcium: 9.5 mg/dL (ref 8.6–10.2)
Chloride: 104 mmol/L (ref 96–106)
Creatinine, Ser: 1.02 mg/dL (ref 0.76–1.27)
Glucose: 92 mg/dL (ref 70–99)
Potassium: 4 mmol/L (ref 3.5–5.2)
Sodium: 140 mmol/L (ref 134–144)
eGFR: 72 mL/min/{1.73_m2} (ref >59)

## 2023-08-18 LAB — CBC
Hematocrit: 38.7 % (ref 37.5–51.0)
Hemoglobin: 12.9 g/dL — ABNORMAL LOW (ref 13.0–17.7)
MCH: 30.1 pg (ref 26.6–33.0)
MCHC: 33.3 g/dL (ref 31.5–35.7)
MCV: 90 fL (ref 79–97)
Platelets: 196 10*3/uL (ref 150–450)
RBC: 4.28 x10E6/uL (ref 4.14–5.80)
RDW: 12.8 % (ref 11.6–15.4)
WBC: 6.6 10*3/uL (ref 3.4–10.8)

## 2023-08-18 LAB — MAGNESIUM: Magnesium: 2.4 mg/dL — ABNORMAL HIGH (ref 1.6–2.3)

## 2023-08-18 MED ORDER — PANTOPRAZOLE SODIUM 40 MG PO TBEC: 40.0000 mg | DELAYED_RELEASE_TABLET | Freq: Two times a day (BID) | ORAL | 0 refills | Status: DC

## 2023-08-18 NOTE — Telephone Encounter (Signed)
 PT is calling to have new prescription sent in for pantoprazole . He scheduled an OV to get continued refills. Medication should be sent to [facility]

## 2023-08-18 NOTE — Telephone Encounter (Signed)
 Patient is scheduled for an office visit with Santina Cull, PA on 09/13/23. Prescription sent to patient's pharmacy until scheduled appt.

## 2023-08-22 ENCOUNTER — Telehealth: Payer: Self-pay

## 2023-08-22 NOTE — Telephone Encounter (Signed)
 Spoke to patient recent lab results given.Patient stated he does not feel good.Stated he saw Lawana Pray NP recently and his symptoms have worsen.He is having more chest tightness.Hard to breathe at times.No energy.Stated at present no chest tightness.No sob.Stated when he has a episode of chest tightness he takes Maalox with relief.Stated he would like another office visit with Lawana Pray NP or he wants to speak to [place].Advised no appointments available with Aleta Anda.Soonest appointment scheduled with Charles Connor NP 5/13 at 8:50 am.Advised if he has any more chest tightness he needs to go to ED to be evaluated.I will make Lawana Pray NP aware of call.

## 2023-08-25 NOTE — Progress Notes (Signed)
 " Cardiology Office Note    Patient Name: Samuel [name] Date of Encounter: 08/29/2023  Primary Care Provider:  Nanci Senior, MD Primary Cardiologist:  Redell Shallow, MD Primary Electrophysiologist: None   Past Medical History    Past Medical History:  Diagnosis Date   Arthritis    Diverticulosis    GERD (gastroesophageal reflux disease)    Heart murmur    Hemorrhoids    Hiatal hernia    Hypertension    Tubular adenoma of colon 01/2009    History of Present Illness  Samuel [name] is a 83 y.o. male with a PMH of atrial fibrillation (on Eliquis ), essential hypertension GERD Barrett's esophagus, dyspnea with lung nodule who presents today with complaint of fatigue, chest pain and shortness of breath.  [name] was seen initially in the ED in 2015 with complaint of chest pain.  Chest x-ray was negative and troponins and hemoglobin were normal.  He underwent a TTE in 2015 that showed grade 1 DD with mild left atrial enlargement and mild MR.  During visit he endorsed occasional sharp chest pain for seconds.  He completed a ETT that was normal and was seen in follow-up on 10/27/2015 with complaint of exertional dyspnea and chest discomfort.  He endorsed discomfort with strenuous activity [place].  He had a treadmill nuclear stress test was completed on 10/28/2015 that showed no evidence of ischemia and was low risk.  He completed an abdominal CT on  01/2018 that showed lower RLL 6 mm nodule atherosclerosis but no evidence of aneurysm.  TTE was completed on 05/20/2019 that showed normal LV function with mild AS.    He was seen initially by Josefa Beauvais, NP on 06/04/2020 for increased dyspnea with exertion.  BP was noted to be elevated at 162/76 but controlled at home.  He was continued on amlodipine and HCTZ patient reported chest discomfort after eating large meal in the evenings.  He was prescribed Pepcid by his PCP along with Protonix .His echocardiogram 06/24/2020 showed an LVEF of 60-65%  moderate LVH and G2 DD.  He was seen on 07/19/21 for follow-up and noted constant leg weakness and shortness of breath with exertion that occurred after receiving his [employer] COVID shot.  He underwent a repeat 2D echo that showed normal EF with mild MVR and no significant valve abnormalities.  He was really sure that fatigue was not related to new cardiac issue and no changes were made to therapy at that time.  He was seen in the ED on 05/09/2022 with complaint of chest pain.  ED workup was reassuring with normal serial troponins.  He was advised to try antacids and follow-up with cardiology.  He was seen in follow-up on 06/17/2022 and had been seen by his gastroenterologist with plans for endoscopy.  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  EKG was completed and showed new onset AF with PACs with controlled rate.  He was started on Eliquis  5 mg twice daily with plan for DCCV in 3 to 4 weeks.  He completed a 2D echo on 07/25/23 that showed stable EF with mild AS.  [name] presents today with his wife for follow-up of complaint of fatigue and chest discomfort. He has been experiencing fatigue and chest tightness, which led to an EKG revealing atrial fibrillation during his last visit. He was started on Eliquis , which he is tolerating well without complications such as bleeding or bruising.  His heart rate was controlled during the episode of atrial fibrillation. He describes a history of chest tightness and difficulty breathing, which has improved  since his last call to the nurse on Aug 22, 2023. He attributes some of his symptoms to gastrointestinal issues and has been taking pantoprazole  twice a day, resulting in significant improvement. He also uses antacids for occasional heart-related pain, which he believes is due to gas.  He reports only taking Protonix  40 mg once daily and has increased to twice daily with improvement. He recalls an episode where he  experienced a panic attack, which he believes triggered his atrial fibrillation. He was subsequently prescribed buspirone for anxiety, which has helped reduce his nervous feelings. He mentions difficulty sleeping since the onset of his atrial fibrillation. He tried a product called Relaxium, which made him sick, and melatonin, which did not help. Magnesium  also caused nausea. He is working on improving his sleep routine. He is trying to drink 64 ounces of water daily, noting that his heart went out of rhythm after a day of dehydration and a panic attack. He is currently taking Norvasc (amlodipine) for blood pressure management, which has been around 140/68. He is monitoring his blood pressure at home.  Patient denies chest pain, palpitations, dyspnea, PND, orthopnea, nausea, vomiting, dizziness, syncope, edema, weight gain, or early satiety.   Discussed the use of AI scribe software for clinical note transcription with the patient, who gave verbal consent to proceed.  History of Present Illness   Review of Systems  Please see the history of present illness.    All other systems reviewed and are otherwise negative except as noted above.  Physical Exam     Wt Readings from Last 3 Encounters:  08/29/23 189 lb 12.8 oz (86.1 kg)  08/17/23 192 lb 9.6 oz (87.4 kg)  07/05/22 194 lb (88 kg)   VS: Vitals:   08/29/23 0851  BP: (!) 140/68  Pulse: 68  SpO2: 96%  ,Body mass index is 26.47 kg/m. GEN: Well nourished, well developed in no acute distress Neck: No JVD; No carotid bruits Pulmonary: Clear to auscultation without rales, wheezing or rhonchi  Cardiovascular: Normal rate. Regular rhythm. Normal S1. Normal S2.   Murmurs: There is no murmur.  ABDOMEN: Soft, non-tender, non-distended EXTREMITIES:  No edema; No deformity   EKG/LABS/ Recent Cardiac Studies   ECG personally reviewed by me today -sinus rhythm with first-degree AVB and rate of 68 bpm with no acute changes consistent with  previous EKG.  Risk Assessment/Calculations:    CHA2DS2-VASc Score = 4   This indicates a 4.8% annual risk of stroke. The patient's score is based upon: CHF History: 0 HTN History: 1 Diabetes History: 0 Stroke History: 0 Vascular Disease History: 1 Age Score: 2 Gender Score: 0         Lab Results  Component Value Date   WBC 6.6 08/17/2023   HGB 12.9 (L) 08/17/2023   HCT 38.7 08/17/2023   MCV 90 08/17/2023   PLT 196 08/17/2023   Lab Results  Component Value Date   CREATININE 1.02 08/17/2023   BUN 16 08/17/2023   NA 140 08/17/2023   K 4.0 08/17/2023   CL 104 08/17/2023   CO2 23 08/17/2023   No results found for: CHOL, HDL, LDLCALC, LDLDIRECT, TRIG, CHOLHDL  No results found for: HGBA1C Assessment & Plan   Assessment & Plan  1.  Paroxysmal AF: -Currently in sinus rhythm, previously in atrial flutter. No cardioversion needed. Continued Eliquis   for stroke prevention. Discussed hydration's role in arrhythmia prevention. Plan to monitor AFib recurrence with a 14-day monitor to evaluate long-term anticoagulation necessity. - Order 14-day cardiac monitor to assess for recurrence of AFib. - Continue Eliquis  5 mg twice daily with patient assistance and provide samples for two weeks. - Educate on the importance of hydration.  2.  Essential hypertension: -Blood pressure around 140/68 mmHg. Goal is 130 mmHg or less. Currently on Norvasc (amlodipine). - Monitor blood pressure at home for two weeks and log results. - Consider increasing Norvasc to 7.5 mg if blood pressure remains elevated.  3.  GERD: - Patient reports GI symptoms primarily associated with chest pain. - He has increased Protonix  to 40 mg twice daily and is scheduled to follow-up with Dr. Roanne his gastroenterologist.  4.  Nonrheumatic mild AS: -Well-managed with no symptoms indicating worsening. - Continue BP control with Norvasc 5 mg and HCTZ 25 mg  5. Anxiety: Experiencing anxiety,  possibly contributing to previous AFib episode. Currently taking buspirone with good effect. Discussed non-pharmacological strategies for anxiety management and AFib reduction. - Continue buspirone. - Educate on sleep hygiene and stress reduction techniques.  Disposition: Follow-up with Redell Shallow, MD or APP in 6 months    Signed, Wyn Raddle, Jackee Shove, NP 08/29/2023, 10:15 AM [facility] "

## 2023-08-29 ENCOUNTER — Telehealth: Payer: Self-pay

## 2023-08-29 ENCOUNTER — Encounter: Payer: Self-pay | Admitting: Nurse Practitioner

## 2023-08-29 ENCOUNTER — Ambulatory Visit: Attending: Nurse Practitioner | Admitting: Nurse Practitioner

## 2023-08-29 ENCOUNTER — Other Ambulatory Visit (HOSPITAL_COMMUNITY): Payer: Self-pay

## 2023-08-29 ENCOUNTER — Ambulatory Visit

## 2023-08-29 VITALS — BP 140/68 | HR 68 | Ht 71.0 in | Wt 189.8 lb

## 2023-08-29 DIAGNOSIS — I350 Nonrheumatic aortic (valve) stenosis: Secondary | ICD-10-CM

## 2023-08-29 DIAGNOSIS — R0602 Shortness of breath: Secondary | ICD-10-CM

## 2023-08-29 DIAGNOSIS — I4891 Unspecified atrial fibrillation: Secondary | ICD-10-CM

## 2023-08-29 DIAGNOSIS — I10 Essential (primary) hypertension: Secondary | ICD-10-CM

## 2023-08-29 DIAGNOSIS — F419 Anxiety disorder, unspecified: Secondary | ICD-10-CM

## 2023-08-29 MED ORDER — APIXABAN 5 MG PO TABS: 5.0000 mg | ORAL_TABLET | Freq: Two times a day (BID) | ORAL | 0 refills | Status: DC

## 2023-08-29 NOTE — Progress Notes (Unsigned)
 Applied a 14 day Zio XT monitor to patients  [place] to read

## 2023-08-29 NOTE — Telephone Encounter (Signed)
 PAP: Patient assistance application for Eliquis  through [employer] has been mailed to pt's home address on file. Provider portion of application will be faxed to provider's office once pt portion has been received.

## 2023-08-29 NOTE — Patient Instructions (Signed)
 Medication Instructions:  Your physician recommends that you continue on your current medications as directed. Please refer to the Current Medication list given to you today. *If you need a refill on your cardiac medications before your next appointment, please call your pharmacy*  Lab Work: None ordered If you have labs (blood work) drawn today and your tests are completely normal, you will receive your results only by: MyChart Message (if you have MyChart) OR A paper copy in the mail If you have any lab test that is abnormal or we need to change your treatment, we will call you to review the results.  Testing/Procedures: Samuel [name]- Long Term Monitor Instructions  Your physician has requested you wear a ZIO patch monitor for 14 days.  This is a single patch monitor. [employer] supplies one patch monitor per enrollment. Additional stickers are not available. Please do not apply patch if you will be having a Nuclear Stress Test,  Echocardiogram, Cardiac CT, MRI, or Chest Xray during the period you would be wearing the  monitor. The patch cannot be worn during these tests. You cannot remove and re-apply the  ZIO XT patch monitor.  Your ZIO patch monitor will be mailed 3 day USPS to your address on file. It may take 3-5 days  to receive your monitor after you have been enrolled.  Once you have received your monitor, please review the enclosed instructions. Your monitor  has already been registered assigning a specific monitor serial # to you.  Billing and Patient Assistance Program Information  We have supplied [employer] with any of your insurance information on file for billing purposes. [employer] offers a sliding scale Patient Assistance Program for patients that do not have  insurance, or whose insurance does not completely cover the cost of the ZIO monitor.  You must apply for the Patient Assistance Program to qualify for this discounted rate.  To apply, please call [employer] at [phone],  select option 4, select option 2, ask to apply for  Patient Assistance Program. Samuel [name] will ask your household income, and how many people  are in your household. They will quote your out-of-pocket cost based on that information.  [employer] will also be able to set up a 55-month, interest-free payment plan if needed.  Applying the monitor   Shave hair from upper left chest.  Hold abrader disc by orange tab. Rub abrader in 40 strokes over the upper left chest as  indicated in your monitor instructions.  Clean area with 4 enclosed alcohol pads. Let dry.  Apply patch as indicated in monitor instructions. Patch will be placed under collarbone on left  side of chest with arrow pointing upward.  Rub patch adhesive wings for 2 minutes. Remove white label marked "1". Remove the white  label marked "2". Rub patch adhesive wings for 2 additional minutes.  While looking in a mirror, press and release button in center of patch. A small green light will  flash 3-4 times. This will be your only indicator that the monitor has been turned on.  Do not shower for the first 24 hours. You may shower after the first 24 hours.  Press the button if you feel a symptom. You will hear a small click. Record Date, Time and  Symptom in the Patient Logbook.  When you are ready to remove the patch, follow instructions on the last 2 pages of Patient  Logbook. Stick patch monitor onto the last page of Patient Logbook.  Place Patient Logbook in the blue  and white box. Use locking tab on box and tape box closed  securely. The blue and white box has prepaid postage on it. Please place it in the mailbox as  soon as possible. Your physician should have your test results approximately 7 days after the  monitor has been mailed back to [facility].  Call [facility] Customer Care at [phone] if you have questions regarding  your ZIO XT patch monitor. Call them immediately if you see an orange light blinking on your   monitor.  If your monitor falls off in less than 4 days, contact our Monitor department at [phone].  If your monitor becomes loose or falls off after 4 days call [employer] at [phone] for  suggestions on securing your monitor   Follow-Up: At [facility], you and your health needs are our priority.  As part of our continuing mission to provide you with exceptional heart care, our providers are all part of one team.  This team includes your primary Cardiologist (physician) and Advanced Practice Providers or APPs (Physician Assistants and Nurse Practitioners) who all work together to provide you with the care you need, when you need it.  Your next appointment:   3 month(s)  Provider:   Lawana Pray, NP        We recommend signing up for the patient portal called "MyChart".  Sign up information is provided on this After Visit Summary.  MyChart is used to connect with patients for Virtual Visits (Telemedicine).  Patients are able to view lab/test results, encounter notes, upcoming appointments, etc.  Non-urgent messages can be sent to your provider as well.   To learn more about what you can do with MyChart, go to [email or web address].   Other Instructions DRINK 64 OUNCES OF WATER A DAY Check your blood pressure daily for 2 weeks, then contact the office with your readings.  Contact the office either by phone or MyChart with your readings.  Make sure to check your blood pressure 2 hours after taking your medications.   AVOID these things for 30 minutes before checking your blood pressure: No Drinking caffeine. No Drinking alcohol. No Eating. No Smoking. No Exercising.  Five minutes before checking your blood pressure: Pee. Sit in a dining chair. Avoid sitting in a soft couch or armchair. Be quiet. Do not talk.

## 2023-08-29 NOTE — Telephone Encounter (Signed)
-----   Message from [facility] Tierica T sent at 08/29/2023  8:55 AM EDT ----- Regarding: patient assistance for Eliquis  Hey,   This patient needs patient assistance for Eliquis .  Thanks,  [employer]

## 2023-09-05 DIAGNOSIS — M9903 Segmental and somatic dysfunction of lumbar region: Secondary | ICD-10-CM | POA: Diagnosis not present

## 2023-09-12 ENCOUNTER — Telehealth: Payer: Self-pay | Admitting: Cardiology

## 2023-09-12 NOTE — Progress Notes (Unsigned)
 09/13/2023 Samuel [name] [id number] 27-Nov-1940  Referring provider: Wyn Heater, MD Primary GI doctor: Dr. Karene Oto ( Dr. Sandrea Cruel)  ASSESSMENT AND PLAN:  Short segment Barrett esophagus 07/05/2022 EGD Z-line variable, gastritis, medium size hiatal hernia normal duodenum bulb, showed Barrett's, chronic gastritis with intestinal metaplasia recall 3 years (06/2025)  GERD with hiatal hernia EGD 06/2022 chronic gastritis with intestinal metaplasia some features of atrophic gastritis Well controlled with pantoprazole  40 mg BID Occ has to take mylanta/pepcid depending on what he eats, worse with fried foods/chocolate, 2-3 x week NSAIDS, no ETOH, no dysphagia, no melena - refill pantoprazole  40 mg BID - can do pepcid for break through - given information about alginate therapy -Check B12, consider antiparietal and antiintrinsic factors  IBS with history of diverticulitis On librax as needed, will take 3-4 x a week On probiotics and has had improvement of symptoms - refill librax - call if any change in symptoms  Personal history of adenomatous colon polyps July 2018 colonoscopy two 6 mm polyp transverse and cecum, mild diverticulosis transverse and cecum severe diverticulosis left colon with narrowing of the colon associate with diverticular opening and spasm No recall due to age  Cystic liver disease Lab Results  Component Value Date   ALT 19 06/06/2022   AST 22 06/06/2022   ALKPHOS 66 06/06/2022   BILITOT 0.6 06/06/2022  Seen on echo recently CT 11/2021 with There are multiple smoothly marginated fluid density lesions in the liver, possibly cysts. Largest of these lesions is in the left lobe measuring 3.5 cm. There is fatty infiltration. There is no dilation of bile ducts. Gallbladder is unremarkable. - likely benign cyst, will repeat liver function, consider US   Moderate aortic stenosis Seen via echo/11/2023 recall 1 year No chest pain, no SOB  Atrial fibrillation On  Eliquis   Follow up in 1 year, call sooner if any worsening symptoms  Patient Care Team: Wyn Heater, MD as PCP - General (Family Medicine) Audery Blazing Deannie Fabian, MD as PCP - Cardiology (Cardiology)  HISTORY OF PRESENT ILLNESS: 83 y.o. male with a past medical history listed below presents for evaluation of IBS and GERD.   Discussed the use of AI scribe software for clinical note transcription with the patient, who gave verbal consent to proceed.  History of Present Illness   Samuel [name] is an 83 year old male with GERD and IBS who presents for a gastroenterology follow-up.  His GERD symptoms are mostly controlled with pantoprazole  40 mg twice daily, although he occasionally requires Mylanta or Pepcid, particularly after consuming fried foods or chocolate. He avoids chocolate and caffeine due to a heart condition, which also aids in managing his GERD. He uses Mylanta or Pepcid about two to three times a week, depending on his diet. No dysphagia or melena.  He manages his IBS with Librax as needed, averaging three to four doses per week, which helps alleviate his 'nervous stomach.' He experiences episodes of loose stools and abdominal discomfort. No hematochezia. He also takes Robinul  to prevent diverticulitis, which he used to have frequently. Since starting a probiotic supplement about six months ago, he has not had any diverticulitis episodes.  He denies alcohol consumption and avoids NSAIDs like Aleve or ibuprofen due to his use of Eliquis . He occasionally takes Gas-X for gassiness.  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 No significant pain or discomfort.      He  reports  that he quit smoking about 59 years ago. His smoking use included cigarettes. He has never used smokeless tobacco. He reports that he does not drink alcohol and does not use drugs.  RELEVANT GI HISTORY, IMAGING AND LABS: Results    LABS Kidney function: normal (07/2023) CBC: normal (07/2023) Magnesium : normal (07/2023)  DIAGNOSTIC Endoscopy: chronic inflammation in stomach, features of atrophic gastritis (2024)     EGD July 2018    Colonoscopy July 2018      Labs / Imaging      CT AP 12/07/2021 There is no evidence of intestinal obstruction or pneumoperitoneum. Appendix is not dilated. There is no hydronephrosis.   Diverticulosis of colon. There is minimal stranding in the fat planes adjacent to the junction of descending and sigmoid colon. This may suggest chronic inflammation. Less likely possibility would be mild acute diverticulitis. There is no loculated pericolic abscess.   Hepatic and renal cysts. Small to moderate sized hiatal hernia. Coronary artery calcifications are seen. CBC    Component Value Date/Time   WBC 6.6 08/17/2023 1507   WBC 7.5 05/09/2022 1259   RBC 4.28 08/17/2023 1507   RBC 4.61 05/09/2022 1259   HGB 12.9 (L) 08/17/2023 1507   HCT 38.7 08/17/2023 1507   PLT 196 08/17/2023 1507   MCV 90 08/17/2023 1507   MCH 30.1 08/17/2023 1507   MCH 29.3 05/09/2022 1259   MCHC 33.3 08/17/2023 1507   MCHC 32.9 05/09/2022 1259   RDW 12.8 08/17/2023 1507   LYMPHSABS 2.3 07/26/2021 1518   MONOABS 0.6 07/26/2021 1518   EOSABS 0.1 07/26/2021 1518   BASOSABS 0.1 07/26/2021 1518   Recent Labs    08/17/23 1507  HGB 12.9*    CMP     Component Value Date/Time   NA 140 08/17/2023 1507   K 4.0 08/17/2023 1507   CL 104 08/17/2023 1507   CO2 23 08/17/2023 1507   GLUCOSE 92 08/17/2023 1507   GLUCOSE 99 05/09/2022 1259   BUN 16 08/17/2023 1507   CREATININE 1.02 08/17/2023 1507   CALCIUM 9.5 08/17/2023 1507   PROT 7.3 06/06/2022 1508   ALBUMIN 4.3 06/06/2022 1508   AST 22 06/06/2022 1508   ALT 19 06/06/2022 1508   ALKPHOS 66 06/06/2022 1508   BILITOT 0.6 06/06/2022 1508   GFRNONAA >60 05/09/2022 1259   GFRAA >60 09/30/2015 1303      Latest Ref Rng & Units 06/06/2022    3:08 PM  07/26/2021    3:18 PM 02/23/2017    4:00 PM  Hepatic Function  Total Protein 6.0 - 8.3 g/dL 7.3  7.2  7.1   Albumin 3.5 - 5.2 g/dL 4.3  4.2  4.0   AST 0 - 37 U/L 22  17  34   ALT 0 - 53 U/L 19  14  32   Alk Phosphatase 39 - 117 U/L 66  69  58   Total Bilirubin 0.2 - 1.2 mg/dL 0.6  0.4  0.3   Bilirubin, Direct 0.0 - 0.3 mg/dL 0.1         Current Medications:    Current Outpatient Medications (Cardiovascular):    amLODipine (NORVASC) 5 MG tablet, Take 5 mg by mouth daily.   hydrochlorothiazide (HYDRODIURIL) 25 MG tablet, Take 25 mg by mouth daily.   Current Outpatient Medications (Analgesics):    celecoxib (CELEBREX) 200 MG capsule, Take 200 mg by mouth as needed. For knee  Current Outpatient Medications (Hematological):    apixaban  (ELIQUIS ) 5 MG TABS  tablet, Take 1 tablet (5 mg total) by mouth 2 (two) times daily.  Current Outpatient Medications (Other):    busPIRone (BUSPAR) 10 MG tablet, Take 10 mg by mouth 2 (two) times daily.   Cholecalciferol (VITAMIN D3) 2000 UNITS TABS, Take 1 tablet by mouth daily.    co-enzyme Q-10 30 MG capsule, Take 30 mg by mouth daily.   Echinacea 400 MG CAPS, Take 1 capsule by mouth daily at 6 (six) AM.   glycopyrrolate  (ROBINUL ) 2 MG tablet, TAKE ONE TABLET BY MOUTH TWICE DAILY (Patient taking differently: Take 2 mg by mouth daily.)   Multiple Vitamins-Minerals (PRESERVISION AREDS) TABS, Take 1 tablet by mouth daily at 6 (six) AM.   Probiotic TBEC, Take 1 capsule by mouth daily at 6 (six) AM.   Turmeric 500 MG TABS, Take 1 tablet by mouth daily.   vitamin E 180 MG (400 UNITS) capsule, Take 400 Units by mouth daily.   Zinc 50 MG TABS, Take 1 tablet by mouth daily.   clidinium-chlordiazePOXIDE  (LIBRAX) 5-2.5 MG capsule, Take 1 capsule by mouth 3 (three) times daily before meals.   pantoprazole  (PROTONIX ) 40 MG tablet, Take 1 tablet (40 mg total) by mouth 2 (two) times daily before a meal.  Medical History:  Past Medical History:  Diagnosis Date    Arthritis    Diverticulosis    GERD (gastroesophageal reflux disease)    Heart murmur    Hemorrhoids    Hiatal hernia    Hypertension    Tubular adenoma of colon 01/2009   Allergies:  Allergies  Allergen Reactions   Codeine     REACTION: Agitation   Sertraline Nausea And Vomiting     Surgical History:  He  has a past surgical history that includes Hernia repair; Colonoscopy; Polypectomy; and Replacement total knee (02/14/11). Family History:  His family history includes Breast cancer in his sister; Pancreatic cancer in his brother.  REVIEW OF SYSTEMS  : All other systems reviewed and negative except where noted in the History of Present Illness.  PHYSICAL EXAM: BP (!) 148/80   Pulse 72   Ht 5\' 11"  (1.803 m)   Wt 190 lb 8 oz (86.4 kg)   BMI 26.57 kg/m  Physical Exam   GENERAL APPEARANCE: Well nourished, in no apparent distress. HEENT: No cervical lymphadenopathy, unremarkable thyroid , sclerae anicteric, conjunctiva pink. RESPIRATORY: Respiratory effort normal, breath sounds clear to auscultation bilaterally, no rales, rhonchi, or wheezing. CARDIO: Systolic crescendo heart murmur present, regular rate and rhythm, peripheral pulses intact. ABDOMEN: Soft, non-distended, active bowel sounds in all four quadrants, no tenderness to palpation, no rebound, no mass appreciated. RECTAL: Declines. MUSCULOSKELETAL: Full range of motion, normal gait, without edema. SKIN: Dry, intact without rashes or lesions. No jaundice. NEURO: Alert, oriented, no focal deficits. PSYCH: Cooperative, normal mood and affect.      Edmonia Gottron, PA-C 9:46 AM

## 2023-09-12 NOTE — Telephone Encounter (Signed)
 error

## 2023-09-13 ENCOUNTER — Ambulatory Visit: Payer: Self-pay | Admitting: Physician Assistant

## 2023-09-13 ENCOUNTER — Ambulatory Visit: Admitting: Physician Assistant

## 2023-09-13 ENCOUNTER — Encounter: Payer: Self-pay | Admitting: Physician Assistant

## 2023-09-13 ENCOUNTER — Other Ambulatory Visit (INDEPENDENT_AMBULATORY_CARE_PROVIDER_SITE_OTHER)

## 2023-09-13 VITALS — BP 148/80 | HR 72 | Ht 71.0 in | Wt 190.5 lb

## 2023-09-13 DIAGNOSIS — E538 Deficiency of other specified B group vitamins: Secondary | ICD-10-CM

## 2023-09-13 DIAGNOSIS — K2270 Barrett's esophagus without dysplasia: Secondary | ICD-10-CM

## 2023-09-13 DIAGNOSIS — I4891 Unspecified atrial fibrillation: Secondary | ICD-10-CM | POA: Diagnosis not present

## 2023-09-13 DIAGNOSIS — Z860101 Personal history of adenomatous and serrated colon polyps: Secondary | ICD-10-CM

## 2023-09-13 DIAGNOSIS — K7689 Other specified diseases of liver: Secondary | ICD-10-CM

## 2023-09-13 DIAGNOSIS — K219 Gastro-esophageal reflux disease without esophagitis: Secondary | ICD-10-CM | POA: Diagnosis not present

## 2023-09-13 DIAGNOSIS — K5790 Diverticulosis of intestine, part unspecified, without perforation or abscess without bleeding: Secondary | ICD-10-CM

## 2023-09-13 DIAGNOSIS — K449 Diaphragmatic hernia without obstruction or gangrene: Secondary | ICD-10-CM | POA: Diagnosis not present

## 2023-09-13 DIAGNOSIS — K589 Irritable bowel syndrome without diarrhea: Secondary | ICD-10-CM

## 2023-09-13 DIAGNOSIS — Z7901 Long term (current) use of anticoagulants: Secondary | ICD-10-CM | POA: Diagnosis not present

## 2023-09-13 LAB — COMPREHENSIVE METABOLIC PANEL WITH GFR
ALT: 22 U/L (ref 0–53)
AST: 23 U/L (ref 0–37)
Albumin: 4.6 g/dL (ref 3.5–5.2)
Alkaline Phosphatase: 70 U/L (ref 39–117)
BUN: 21 mg/dL (ref 6–23)
CO2: 28 meq/L (ref 19–32)
Calcium: 9.8 mg/dL (ref 8.4–10.5)
Chloride: 103 meq/L (ref 96–112)
Creatinine, Ser: 0.91 mg/dL (ref 0.40–1.50)
GFR: 77.05 mL/min (ref >60.00)
Glucose, Bld: 94 mg/dL (ref 70–99)
Potassium: 4.3 meq/L (ref 3.5–5.1)
Sodium: 140 meq/L (ref 135–145)
Total Bilirubin: 0.5 mg/dL (ref 0.2–1.2)
Total Protein: 7.4 g/dL (ref 6.0–8.3)

## 2023-09-13 LAB — VITAMIN B12: Vitamin B-12: <50 pg/mL

## 2023-09-13 MED ORDER — PANTOPRAZOLE SODIUM 40 MG PO TBEC: 40.0000 mg | DELAYED_RELEASE_TABLET | Freq: Two times a day (BID) | ORAL | 3 refills | Status: AC

## 2023-09-13 MED ORDER — CILIDINIUM-CHLORDIAZEPOXIDE 2.5-5 MG PO CAPS: 1.0000 | ORAL_CAPSULE | Freq: Three times a day (TID) | ORAL | 3 refills | Status: AC

## 2023-09-13 MED ORDER — CYANOCOBALAMIN 1000 MCG/ML IJ SOLN: 1000.0000 ug | INTRAMUSCULAR | Status: AC

## 2023-09-13 NOTE — Patient Instructions (Signed)
 Your provider has requested that you go to the basement level for lab work before leaving today. Press "B" on the elevator. The lab is located at the first door on the left as you exit the elevator.  Please take your proton pump inhibitor medication, protonix  40 mg TWICE a day Please take this medication 30 minutes to 1 hour before meals- this makes it more effective.  Can take pepcid as needed Avoid spicy and acidic foods Avoid fatty foods Limit your intake of coffee, tea, alcohol, and carbonated drinks Work to maintain a healthy weight Keep the head of the bed elevated at least 3 inches with blocks or a wedge pillow if you are having any nighttime symptoms Stay upright for 2 hours after eating Avoid meals and snacks three to four hours before bedtime   Reflux Gourmet Rescue  It is an ALGINATE THERAPY which is the only intervention that works to safeguard the esophagus by creating a protective barrier that actually stops reflux from happening. -The general directions for use are as stated on the packaging: Take 1 teaspoon (5 ml), or more as needed or as directed by your physician, after meals and before bed. -These general directions address the most common times for reflux to occur, but our Rescue products may be taken anytime. Some individuals may take our product preemptively, when they know they will suffer from reflux, or as needed - when discomfort arises. (If taken around food, it should be consumed last.) -You do not have to take 1 teaspoon (5 ml) of the product. While one teaspoon (5ml) may be the perfect average amount to relieve reflux suffering in some, others may require more or less. You may adjust the amount of Mint Chocolate Rescue and Vanilla Caramel Rescue to the lowest amount necessary to meet your individual needs to improve your quality of life. -You may dilute the product if it is too viscous for you to consume. Keep in mind, however, that the thickness of the product was  formulated to provide optimal coating and protection of your throat and esophagus. Though diluting the product is possible, it may reduce the protective function and/or length of action. -This can be used in conjunction with reflux medications and lifestyle changes.  100% ALL-NATURAL  Paraben FREE, glycerin FREE, & potassium FREE  Made entirely from all-natural ingredients considered safe for children and during pregnancy  No known side effects  All-natural flavor Gluten FREE  Allergen FREE  Vegan  Can find more information here: [email or web address]-science-of-alginate-[email or web address]

## 2023-09-14 ENCOUNTER — Ambulatory Visit: Admitting: General Practice

## 2023-09-15 ENCOUNTER — Ambulatory Visit: Payer: Self-pay | Admitting: Nurse Practitioner

## 2023-09-15 DIAGNOSIS — R0602 Shortness of breath: Secondary | ICD-10-CM

## 2023-09-15 DIAGNOSIS — I4891 Unspecified atrial fibrillation: Secondary | ICD-10-CM

## 2023-09-19 ENCOUNTER — Telehealth: Payer: Self-pay | Admitting: Nurse Practitioner

## 2023-09-19 DIAGNOSIS — E538 Deficiency of other specified B group vitamins: Secondary | ICD-10-CM | POA: Diagnosis not present

## 2023-09-19 NOTE — Telephone Encounter (Signed)
 Please let [name] know that his blood pressures appear to be stable and currently at goal.  Please advise patient to continue current medication regimen with amlodipine 5 mg daily.  Please advise patient continue to monitor periodically and to contact our office if he has any further questions.  Samuel Connor, NP

## 2023-09-19 NOTE — Telephone Encounter (Signed)
 Contacted the patient and made him aware. He voiced understanding and thanked me for the call.

## 2023-09-21 NOTE — Telephone Encounter (Signed)
 Patient no longer interested in assistance. Closing out encounter.

## 2023-09-26 DIAGNOSIS — E538 Deficiency of other specified B group vitamins: Secondary | ICD-10-CM | POA: Diagnosis not present

## 2023-10-02 ENCOUNTER — Ambulatory Visit: Admitting: General Practice

## 2023-10-03 DIAGNOSIS — M9903 Segmental and somatic dysfunction of lumbar region: Secondary | ICD-10-CM | POA: Diagnosis not present

## 2023-10-03 DIAGNOSIS — E538 Deficiency of other specified B group vitamins: Secondary | ICD-10-CM | POA: Diagnosis not present

## 2023-10-10 DIAGNOSIS — E538 Deficiency of other specified B group vitamins: Secondary | ICD-10-CM | POA: Diagnosis not present

## 2023-10-24 NOTE — Progress Notes (Signed)
 Agree with the assessment and plan as outlined by Quentin Mulling, PA-C. ? ?Keron Neenan, DO, FACG ? ?

## 2023-10-31 DIAGNOSIS — M9903 Segmental and somatic dysfunction of lumbar region: Secondary | ICD-10-CM | POA: Diagnosis not present

## 2023-11-06 ENCOUNTER — Telehealth: Payer: Self-pay | Admitting: Cardiology

## 2023-11-06 NOTE — Telephone Encounter (Signed)
 Patient notified.  He has only had that one time reading of 43. Other readings have been in the 60's.   He uses Omron BP cuff to check heart rate and BP. He is not having any dizziness and is feeling fine.  Most recent reading today was 145/72, 69.  Patient will continue to monitor BP and heart rate and bring readings to office visit.  ED precautions reviewed with patient.

## 2023-11-06 NOTE — Telephone Encounter (Signed)
 Pt c/o BP issue:  1. What are your last 5 BP readings? 121/66 with HR 43 today an hour ago/ he just took it and 145/80 hr 70 2. Are you having any other symptoms (ex. Dizziness, headache, blurred vision, passed out)? Pt denied any of these symptoms.  A couple muscle like spasm this weekend  3. What is your medication issue? NA   Best number [phone]

## 2023-11-06 NOTE — Telephone Encounter (Signed)
 Darryle Thom CROME, PA-C to [facility] Triage     11/06/23  1:41 PM I wonder if this is actually accurate, if this is on pulse ox sometimes it can be inaccurate due to cold fingers or poor signal.  I would ask him to monitor his symptoms and write down what his heart rates are as well as his blood pressures to see if there is a correlation.   Did he note whether he was feeling lightheaded or dizzy?   Provide ER precautions if you have not already given so.  Please.

## 2023-11-06 NOTE — Telephone Encounter (Signed)
 Received incoming STAT Triage call. Pt states his Pulse was low at 43 and BP=121/66. He rechecked it after about 20 minutes and BP was 145/80, Pulse was 70. On Sunday he reports he had one episode of short pains in Chest. He is not sure if it was gas, he took Gas X and 45 min later it was okay.   Pt shares that on May 18 his heart was out of rhythm and they started him on Eliquis . His PCP prescribed Buspar 10 mg BID for anxiety, and this has helped tremendously. He would really like a sooner appt with either Emelia, NP or Wyn, NP but neither have open slots in the next three weeks. After addressing this with Lucien, [place] an appointment for today (11/06/23) at 2:45 pm was offered to pt, with [place], PA but pt was not able to make it so soon. The next available appt was on 11/14/23 at 1:15 pm, and pt agreed to this (with Parthenia, PA).   Pt is currenlty on Eliquis  5 mg BID, and other Cardiac meds; only new med is Buspar. He recently wore a 14 day Zio , results available in chart under 09/15/2023 .  ED precautions reviewed with pt who verbalized understanding.

## 2023-11-07 NOTE — Progress Notes (Signed)
 Cardiology Office Note:  .   Date:  11/14/2023  ID:  SHANAN MCMILLER, DOB 1941/02/25, MRN [id number] PCP: Nanci Senior, MD   [facility] Providers Cardiologist:  Redell Shallow, MD Cardiology APP:  Emelia Josefa HERO, NP    History of Present Illness: .   DEAUNTE DENTE is a 83 y.o. male  with a PMH of atrial fibrillation (on Eliquis ), essential hypertension GERD Barrett's esophagus, dyspnea with lung nodule.  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  EKG was completed and showed new onset AF with PACs with controlled rate.  He was started on Eliquis  5 mg twice daily with plan for DCCV in 3 to 4 weeks.  He completed a 2D echo on 07/25/23 that showed stable EF with mild AS.  Patient called in with HR reading 43 but then came up to 69 on recheck. Also had atypical chest pain relieved with Gas X. Patient here with his wife. He thinks he didn't have the BP cuff on correctly. BP and P readings from home normal. 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 He also has GERD relieved with gas x. His anxiety is doing better on buspar.       ROS:    Studies Reviewed: SABRA         Prior CV Studies:   Monitor 08/2023 Patch Wear Time:  13 days and 22 hours (2025-05-13T09:49:10-398 to 2025-05-27T08:47:05-398)   Patient had a min HR of 22 bpm, max HR of 207 bpm, and avg HR of 70 bpm. Predominant underlying rhythm was Sinus Rhythm. First Degree AV Block was present. 6 Ventricular Tachycardia runs occurred, the run with the fastest interval lasting 4 beats with a  max rate of 207 bpm, the longest lasting 14 beats with an avg rate of 132 bpm. 2 Supraventricular Tachycardia runs occurred, the run with the fastest interval lasting 4 beats with a max rate of 104 bpm, the longest lasting 8 beats with an avg rate of 102  bpm. 2 Pauses occurred, the longest lasting 3.3 secs (18 bpm). Pauses occurred due to possible Atrial Tachycardia  with block. Second Degree AV Block-Mobitz I (Wenckebach) was present. Isolated SVEs were occasional (1.5%, 20821), SVE Couplets were rare  (<1.0%, 582), and SVE Triplets were rare (<1.0%, 169). Isolated VEs were frequent (14.6%, 208123), VE Couplets were rare (<1.0%, 4234), and VE Triplets were rare (<1.0%, 231). Ventricular Bigeminy and Trigeminy were present.   Sinus bradycardia, normal sinus rhythm, PACs, short runs of SVT, PVCs and 6 beats nonsustained ventricular tachycardia.  Patient also noted to have intermittent Mobitz 1 second-degree block in the early a.m. hours.  Had 3.3 and 3-second pauses at 4:03 AM and 3:38 AM. Redell Shallow  Echo 07/2023 IMPRESSIONS     1. Left ventricular ejection fraction, by estimation, is 55 to 60%. The  left ventricle has normal function. The left ventricle has no regional  wall motion abnormalities. Left ventricular diastolic parameters were  normal.   2. Right ventricular systolic function is mildly reduced. The right  ventricular size is mildly enlarged. There is normal pulmonary artery  systolic pressure.   3. Left atrial size was moderately dilated.   4. Color flow near septum on limited views suggest f/u bubble study  Cystic liver disease noted . sub optimal.   5. Right atrial size was moderately dilated.   6. The mitral valve is abnormal. Mild  mitral valve regurgitation. No  evidence of mitral stenosis.   7. Prior gradients on TTE 08/08/22 mean 18 peak 33 mmHg stable moderate AS  . The aortic valve is tricuspid. There is moderate calcification of the  aortic valve. There is moderate thickening of the aortic valve. Aortic  valve regurgitation is trivial.  Moderate aortic valve stenosis.   8. The inferior vena cava is normal in size with greater than 50%  respiratory variability, suggesting right atrial pressure of 3 mmHg.  Risk Assessment/Calculations:    CHA2DS2-VASc Score = 4   This indicates a 4.8% annual risk of stroke. The patient's  score is based upon: CHF History: 0 HTN History: 1 Diabetes History: 0 Stroke History: 0 Vascular Disease History: 1 Age Score: 2 Gender Score: 0         Physical Exam:   VS:  BP (!) 140/78   Pulse 70   Ht 5' 11 (1.803 m)   Wt 185 lb (83.9 kg)   SpO2 96%   BMI 25.80 kg/m    Orhtostatics: No data found. Wt Readings from Last 3 Encounters:  11/14/23 185 lb (83.9 kg)  09/13/23 190 lb 8 oz (86.4 kg)  08/29/23 189 lb 12.8 oz (86.1 kg)    GEN: Well nourished, well developed in no acute distress NECK: No JVD; No carotid bruits CARDIAC: RRR, with some skipping, 2/6/systolic murmur LSB RESPIRATORY:  Clear to auscultation without rales, wheezing or rhonchi  ABDOMEN: Soft, non-tender, non-distended EXTREMITIES:  No edema; No deformity   ASSESSMENT AND PLAN: .     Paroxysmal AF: -Currently in sinus rhythm, previously in atrial flutter.  Continued Eliquis  for stroke prevention. Discussed hydration's role in arrhythmia prevention.   14-day cardiac monitor no AFib.3.3 and 3 second pauses  - Continue Eliquis  5 mg twice daily with patient assistance  - Educate on the importance of hydration. -will give diltiazem  30 mg prn for palpitations, if recurrent Afib may need antiarrhythmic.      Essential hypertension: -Blood pressure around 140/68 mmHg. Goal is 130 mmHg or less. Currently on Norvasc (amlodipine). -blood pressure at home for two weeks stable     GERD: - Patient reports GI symptoms primarily associated with chest pain relieved with gas x.      Nonrheumatic mild AS: moderate on echo 07/25/23 -Well-managed with no symptoms indicating worsening. - Continue BP control with Norvasc 5 mg and HCTZ 25 mg    Anxiety: - improved on  buspirone.            Dispo: f/u with Dr. Pietro  Signed, Olivia Pavy, PA-C

## 2023-11-14 ENCOUNTER — Ambulatory Visit: Attending: Internal Medicine | Admitting: Physician Assistant

## 2023-11-14 ENCOUNTER — Encounter: Payer: Self-pay | Admitting: Physician Assistant

## 2023-11-14 VITALS — BP 140/78 | HR 70 | Ht 71.0 in | Wt 185.0 lb

## 2023-11-14 DIAGNOSIS — I10 Essential (primary) hypertension: Secondary | ICD-10-CM | POA: Diagnosis not present

## 2023-11-14 DIAGNOSIS — K219 Gastro-esophageal reflux disease without esophagitis: Secondary | ICD-10-CM

## 2023-11-14 DIAGNOSIS — I350 Nonrheumatic aortic (valve) stenosis: Secondary | ICD-10-CM | POA: Diagnosis not present

## 2023-11-14 DIAGNOSIS — I4891 Unspecified atrial fibrillation: Secondary | ICD-10-CM

## 2023-11-14 DIAGNOSIS — F419 Anxiety disorder, unspecified: Secondary | ICD-10-CM

## 2023-11-14 DIAGNOSIS — E538 Deficiency of other specified B group vitamins: Secondary | ICD-10-CM | POA: Diagnosis not present

## 2023-11-14 MED ORDER — DILTIAZEM HCL 30 MG PO TABS: 30.0000 mg | ORAL_TABLET | Freq: Every day | ORAL | 0 refills | Status: AC | PRN

## 2023-11-14 NOTE — Patient Instructions (Signed)
 Medication Instructions:  Your physician has recommended you make the following change in your medication:   START Diltiazem  30 mg taking daily only as needed for palpitatoins  *If you need a refill on your cardiac medications before your next appointment, please call your pharmacy*  Lab Work: None ordered  If you have labs (blood work) drawn today and your tests are completely normal, you will receive your results only by: MyChart Message (if you have MyChart) OR A paper copy in the mail If you have any lab test that is abnormal or we need to change your treatment, we will call you to review the results.  Testing/Procedures: None ordered  Follow-Up: At [facility], you and your health needs are our priority.  As part of our continuing mission to provide you with exceptional heart care, our providers are all part of one team.  This team includes your primary Cardiologist (physician) and Advanced Practice Providers or APPs (Physician Assistants and Nurse Practitioners) who all work together to provide you with the care you need, when you need it.  Your next appointment:   6 month(s)  Provider:   Redell Shallow, MD    We recommend signing up for the patient portal called MyChart.  Sign up information is provided on this After Visit Summary.  MyChart is used to connect with patients for Virtual Visits (Telemedicine).  Patients are able to view lab/test results, encounter notes, upcoming appointments, etc.  Non-urgent messages can be sent to your provider as well.   To learn more about what you can do with MyChart, go to [email or web address].   Other Instructions

## 2023-11-16 DIAGNOSIS — I10 Essential (primary) hypertension: Secondary | ICD-10-CM | POA: Diagnosis not present

## 2023-11-16 DIAGNOSIS — J449 Chronic obstructive pulmonary disease, unspecified: Secondary | ICD-10-CM | POA: Diagnosis not present

## 2023-11-17 DIAGNOSIS — I10 Essential (primary) hypertension: Secondary | ICD-10-CM | POA: Diagnosis not present

## 2023-11-17 DIAGNOSIS — E538 Deficiency of other specified B group vitamins: Secondary | ICD-10-CM | POA: Diagnosis not present

## 2023-11-17 DIAGNOSIS — Z0000 Encounter for general adult medical examination without abnormal findings: Secondary | ICD-10-CM | POA: Diagnosis not present

## 2023-11-17 DIAGNOSIS — I700 Atherosclerosis of aorta: Secondary | ICD-10-CM | POA: Diagnosis not present

## 2023-11-17 DIAGNOSIS — K589 Irritable bowel syndrome without diarrhea: Secondary | ICD-10-CM | POA: Diagnosis not present

## 2023-11-17 DIAGNOSIS — I480 Paroxysmal atrial fibrillation: Secondary | ICD-10-CM | POA: Diagnosis not present

## 2023-11-27 ENCOUNTER — Other Ambulatory Visit: Payer: Self-pay | Admitting: General Practice

## 2023-11-27 NOTE — Telephone Encounter (Signed)
 Prescription refill request for Eliquis  received. Indication:afib Last office visit:7/25 Scr:0.91  5/25 Age: 83 Weight:83.9  kg  Prescription refilled

## 2023-11-27 NOTE — Telephone Encounter (Signed)
 Prescription refill request for Eliquis received. Indication:afib Last office visit:7/25 Scr:0.91  5/25 Age: 83 Weight:83.9  kg  Prescription refilled

## 2023-11-28 DIAGNOSIS — M9903 Segmental and somatic dysfunction of lumbar region: Secondary | ICD-10-CM | POA: Diagnosis not present

## 2023-11-29 DIAGNOSIS — S81811A Laceration without foreign body, right lower leg, initial encounter: Secondary | ICD-10-CM | POA: Diagnosis not present

## 2023-11-30 ENCOUNTER — Ambulatory Visit: Admitting: General Practice

## 2023-12-12 DIAGNOSIS — E538 Deficiency of other specified B group vitamins: Secondary | ICD-10-CM | POA: Diagnosis not present

## 2023-12-17 DIAGNOSIS — J449 Chronic obstructive pulmonary disease, unspecified: Secondary | ICD-10-CM | POA: Diagnosis not present

## 2023-12-17 DIAGNOSIS — I10 Essential (primary) hypertension: Secondary | ICD-10-CM | POA: Diagnosis not present

## 2023-12-25 DIAGNOSIS — M9903 Segmental and somatic dysfunction of lumbar region: Secondary | ICD-10-CM | POA: Diagnosis not present

## 2024-01-01 DIAGNOSIS — M9903 Segmental and somatic dysfunction of lumbar region: Secondary | ICD-10-CM | POA: Diagnosis not present

## 2024-01-05 DIAGNOSIS — R509 Fever, unspecified: Secondary | ICD-10-CM | POA: Diagnosis not present

## 2024-01-08 DIAGNOSIS — M79641 Pain in right hand: Secondary | ICD-10-CM | POA: Diagnosis not present

## 2024-01-08 DIAGNOSIS — M25531 Pain in right wrist: Secondary | ICD-10-CM | POA: Diagnosis not present

## 2024-01-10 DIAGNOSIS — M79641 Pain in right hand: Secondary | ICD-10-CM | POA: Diagnosis not present

## 2024-01-12 DIAGNOSIS — E538 Deficiency of other specified B group vitamins: Secondary | ICD-10-CM | POA: Diagnosis not present

## 2024-01-15 DIAGNOSIS — M9903 Segmental and somatic dysfunction of lumbar region: Secondary | ICD-10-CM | POA: Diagnosis not present

## 2024-01-16 DIAGNOSIS — I10 Essential (primary) hypertension: Secondary | ICD-10-CM | POA: Diagnosis not present

## 2024-01-16 DIAGNOSIS — J449 Chronic obstructive pulmonary disease, unspecified: Secondary | ICD-10-CM | POA: Diagnosis not present

## 2024-01-24 ENCOUNTER — Other Ambulatory Visit: Payer: Self-pay | Admitting: Physician Assistant

## 2024-01-25 ENCOUNTER — Telehealth: Payer: Self-pay | Admitting: Cardiology

## 2024-01-25 NOTE — Telephone Encounter (Signed)
  Pt c/o of Chest Pain: STAT if active (IN THIS MOMENT) CP, including tightness, pressure, jaw pain, shoulder/upper arm/back pain, SOB, nausea, and vomiting.  1. Are you having CP right now (tightness, pressure, or discomfort)? Not right now  2. Are you experiencing any other symptoms (ex. SOB, nausea, vomiting, sweating)? Says he feels like a electrical shock in his heart every once in a while, having fatigue  3. How long have you been experiencing CP? About a month  4. Is your CP continuous or coming and going? Comes and goes  5. Have you taken Nitroglycerin? no  6. If CP returns before callback, please consider calling 911. ?

## 2024-01-25 NOTE — Telephone Encounter (Signed)
 Call to patient to discuss symptoms. Patient reports he has been feeling brief electrical shock feelings to his heart for the past month.  He says this is different from his atypical chest pain he usually experiences. He says sometimes he has several shock feelings and gas-x alleviates this but not always. As these symptoms sound very similar to symptoms described in note from OV 11/14/23 with Olivia Pavy, asked patient if he had tried the cardizem  that was prescribed PRN to manage palps. He states he is scared to take it because his HR is low sometimes at home, but unfortunately he has not checked his HR or BP at home recently. He denies any Sob or leg swelling. Patient requests appt, made appt with EMERSON Pavy for 01/30/24. Discussed ED precautions, patient verbalizes understanding to go to ED or call 911 if chest pain worsens, is continuous or is accompanied by SOB.

## 2024-01-26 ENCOUNTER — Telehealth: Payer: Self-pay

## 2024-01-26 NOTE — Telephone Encounter (Signed)
 Pharmacy Patient Advocate Encounter   Received notification from CoverMyMeds that prior authorization for Glycopyrrolate  2MG  tablets is required/requested.   Insurance verification completed.   The patient is insured through [facility].   Per test claim: PA required; PA submitted to above mentioned insurance via Latent Key/confirmation #/EOC [id number] Status is pending

## 2024-01-26 NOTE — Progress Notes (Signed)
 Cardiology Office Note:  .   Date:  01/30/2024  ID:  Samuel [name], DOB 1940/10/01, MRN [id number] PCP: Nanci Senior, MD  [facility] Providers Cardiologist:  Redell Shallow, MD Cardiology APP:  Emelia Josefa HERO, NP    History of Present Illness: .   Samuel [name] is a 83 y.o. male  with a PMH of atrial fibrillation (on Eliquis ), essential hypertension GERD Barrett's esophagus, dyspnea with lung nodule.   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  EKG was completed and showed new onset AF with PACs with controlled rate.  He was started on Eliquis  5 mg twice daily with plan for DCCV in 3 to 4 weeks.  He completed a 2D echo on 07/25/23 that showed stable EF with mild AS.   I saw the Patient 10/2023 HR reading 43 but then came up to 69 on recheck. Also had atypical chest pain relieved with Gas X. Patient here with his wife. He thinks he didn't have the BP cuff on correctly. BP and P readings from home normal. 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 He also has GERD relieved with gas x. His anxiety is doing better on buspar.   Patient called in with similar symptoms but was afraid to try prn cardizem  due to bradycardia. Patient here complaining of the sharp shooting chest pain lasting a second after he comes in from working. Sometimes relieved with GasX . It worries him. It isn't consistent.  When he takes his BP it looks like his heart is skipping. HR 46-78, BP 149/75-152/74. He doesn't take his BP meds consistent-sometimes in am other days in the evening.Roxana he had a leak in his house and was crawling around and had no issues at all.     ROS:    Studies Reviewed: SABRA         Prior CV Studies:    Monitor 08/2023 Patch Wear Time:  13 days and 22 hours (2025-05-13T09:49:10-398 to 2025-05-27T08:47:05-398)   Patient had a min HR of 22 bpm, max HR of 207 bpm, and avg HR of 70 bpm. Predominant underlying  rhythm was Sinus Rhythm. First Degree AV Block was present. 6 Ventricular Tachycardia runs occurred, the run with the fastest interval lasting 4 beats with a  max rate of 207 bpm, the longest lasting 14 beats with an avg rate of 132 bpm. 2 Supraventricular Tachycardia runs occurred, the run with the fastest interval lasting 4 beats with a max rate of 104 bpm, the longest lasting 8 beats with an avg rate of 102  bpm. 2 Pauses occurred, the longest lasting 3.3 secs (18 bpm). Pauses occurred due to possible Atrial Tachycardia with block. Second Degree AV Block-Mobitz I (Wenckebach) was present. Isolated SVEs were occasional (1.5%, 20821), SVE Couplets were rare  (<1.0%, 582), and SVE Triplets were rare (<1.0%, 169). Isolated VEs were frequent (14.6%, 208123), VE Couplets were rare (<1.0%, 4234), and VE Triplets were rare (<1.0%, 231). Ventricular Bigeminy and Trigeminy were present.   Sinus bradycardia, normal sinus rhythm, PACs, short runs of SVT, PVCs and 6 beats nonsustained ventricular tachycardia.  Patient also noted to have intermittent Mobitz 1 second-degree block in the early a.m. hours.  Had 3.3 and 3-second pauses at 4:03 AM and 3:38 AM. Redell Shallow   Echo 07/2023 IMPRESSIONS     1. Left ventricular ejection fraction, by estimation, is 55 to 60%. The  left ventricle has normal function. The left ventricle has no regional  wall motion abnormalities. Left ventricular diastolic parameters were  normal.   2. Right ventricular systolic function is mildly reduced. The right  ventricular size is mildly enlarged. There is normal pulmonary artery  systolic pressure.   3. Left atrial size was moderately dilated.   4. Color flow near septum on limited views suggest f/u bubble study  Cystic liver disease noted . sub optimal.   5. Right atrial size was moderately dilated.   6. The mitral valve is abnormal. Mild mitral valve regurgitation. No  evidence of mitral stenosis.   7. Prior gradients on  TTE 08/08/22 mean 18 peak 33 mmHg stable moderate AS  . The aortic valve is tricuspid. There is moderate calcification of the  aortic valve. There is moderate thickening of the aortic valve. Aortic  valve regurgitation is trivial.  Moderate aortic valve stenosis.   8. The inferior vena cava is normal in size with greater than 50%  respiratory variability, suggesting right atrial pressure of 3 mmHg.  Risk Assessment/Calculations:    CHA2DS2-VASc Score = 4   This indicates a 4.8% annual risk of stroke. The patient's score is based upon: CHF History: 0 HTN History: 1 Diabetes History: 0 Stroke History: 0 Vascular Disease History: 1 Age Score: 2 Gender Score: 0    HYPERTENSION CONTROL Vitals:   01/30/24 0848 01/30/24 0922  BP: (!) 150/66 (!) 142/82    The patient's blood pressure is elevated above target today.  In order to address the patient's elevated BP: Blood pressure will be monitored at home to determine if medication changes need to be made.; Follow up with general cardiology has been recommended.; Follow up with primary care provider for management.          Physical Exam:   VS:  BP (!) 142/82   Pulse (!) 59   Ht 5' 10 (1.778 m)   Wt 187 lb 12.8 oz (85.2 kg)   SpO2 94%   BMI 26.95 kg/m    Orhtostatics: No data found. Wt Readings from Last 3 Encounters:  01/30/24 187 lb 12.8 oz (85.2 kg)  11/14/23 185 lb (83.9 kg)  09/13/23 190 lb 8 oz (86.4 kg)    GEN: Well nourished, well developed in no acute distress NECK: No JVD; No 3/6 systolic murmur LSB RESPIRATORY:  Clear to auscultation without rales, wheezing or rhonchi  ABDOMEN: Soft, non-tender, non-distended EXTREMITIES:  No edema; No deformity   ASSESSMENT AND PLAN: .   Paroxysmal AF: -Currently in sinus rhythm, previously in atrial flutter.  Continued Eliquis  for stroke prevention. Discussed hydration's role in arrhythmia prevention.   14-day cardiac monitor no AFib.3.3 and 3 second pauses during sleeping  hours - Continue Eliquis  5 mg twice daily with patient assistance  - Educate on the importance of hydration. -no further palpitations or need for prn diltiazem      Essential hypertension: -Blood pressure around 140/68 mmHg. Goal is 130 mmHg or less. Currently on Norvasc (amlodipine) & HCTZ. -blood pressure high today-hasn't taken his meds and elevated on readings from home. Not taking his meds consistently. He will take regularly and keep track of BP's. If still high consider adding ARB     GERD: - Patient reports GI symptoms primarily associated with chest pain relieved with gas x. Doesn't want to pursue stress test at this time. Chest pain atypical and relieved with gasx.       Nonrheumatic mild AS: moderate on echo  07/25/23 -Well-managed with no symptoms indicating worsening. - Continue BP control with Norvasc 5 mg and HCTZ 25 mg    Anxiety: - improved on  buspirone.          Dispo: f/u Dr. Pietro 2-3 months or sooner if needed  Signed, Olivia Pavy, PA-C

## 2024-01-29 NOTE — Telephone Encounter (Signed)
 Pt stated that he has been taking it once a day and it has been working well for him.

## 2024-01-29 NOTE — Telephone Encounter (Signed)
 Pharmacy Patient Advocate Encounter  Received notification from [facility] Medicare that Prior Authorization for Glycopyrrolate  2MG  tablets has been DENIED.  Full denial letter will be uploaded to the media tab. See denial reason below.  Medication authorization requires the following:  (1) You need to try one (1) of these covered drugs:  (A) Cimetidine (B) Dexlansoprazole (C) Glycopyrrolate  solution* (D) Lansoprazole (E) Nizatidine (F) Rabeprazole (2) OR your doctor needs to give us  specific medical reasons why one (1) of the covered drug(s) is not appropriate for you  PA #/Case ID/Reference #: [id number]

## 2024-01-30 ENCOUNTER — Encounter: Payer: Self-pay | Admitting: Physician Assistant

## 2024-01-30 ENCOUNTER — Ambulatory Visit: Attending: Physician Assistant | Admitting: Physician Assistant

## 2024-01-30 VITALS — BP 142/82 | HR 59 | Ht 70.0 in | Wt 187.8 lb

## 2024-01-30 DIAGNOSIS — I060 Rheumatic aortic stenosis: Secondary | ICD-10-CM

## 2024-01-30 DIAGNOSIS — I4891 Unspecified atrial fibrillation: Secondary | ICD-10-CM | POA: Diagnosis not present

## 2024-01-30 DIAGNOSIS — K219 Gastro-esophageal reflux disease without esophagitis: Secondary | ICD-10-CM

## 2024-01-30 DIAGNOSIS — I10 Essential (primary) hypertension: Secondary | ICD-10-CM

## 2024-01-30 DIAGNOSIS — F419 Anxiety disorder, unspecified: Secondary | ICD-10-CM

## 2024-01-30 NOTE — Patient Instructions (Signed)
 Medication Instructions:  START TAKING MEDICATIONS AS PRESCRIBED.  Lab Work: NONE TO BE DONE TODAY.   Testing/Procedures: NONE  Follow-Up: At [facility], you and your health needs are our priority.  As part of our continuing mission to provide you with exceptional heart care, our providers are all part of one team.  This team includes your primary Cardiologist (physician) and Advanced Practice Providers or APPs (Physician Assistants and Nurse Practitioners) who all work together to provide you with the care you need, when you need it.  Your next appointment:   2-3 MONTHS  Provider:   Redell Shallow, MD    We recommend signing up for the patient portal called MyChart.  Sign up information is provided on this After Visit Summary.  MyChart is used to connect with patients for Virtual Visits (Telemedicine).  Patients are able to view lab/test results, encounter notes, upcoming appointments, etc.  Non-urgent messages can be sent to your provider as well.   To learn more about what you can do with MyChart, go to [email or web address].   Other Instructions: PLEASE BE SURE TO TAKE AND RECORD BOTH YOUR BLOOD PRESSURE AND HEART RATE FOR 2 WEEKS, THEN SEND TO YOUR PROVIDER VIA MYCHART.

## 2024-01-30 NOTE — Telephone Encounter (Signed)
 Pt stated that he was not on any of the medications mentioned.  Pt stated that he would go online to and obtain a Good RX coupon.  Pt notified to call our office back if he has any issues.  Pt verbalized understanding with all questions answered.

## 2024-01-31 NOTE — Telephone Encounter (Signed)
 Spoke to pt about GoodRX.  Pt verbalized understanding with all questions answered.

## 2024-01-31 NOTE — Telephone Encounter (Signed)
 Inbound call from patient requesting to speak with nurse Elspeth regarding medication. Please advise.

## 2024-02-05 DIAGNOSIS — M9903 Segmental and somatic dysfunction of lumbar region: Secondary | ICD-10-CM | POA: Diagnosis not present

## 2024-03-26 ENCOUNTER — Other Ambulatory Visit: Payer: Self-pay | Admitting: General Practice

## 2024-03-26 NOTE — Telephone Encounter (Signed)
 Prescription refill request for Eliquis  received. Indication: AF Last office visit: 01/30/24  CHRISTELLA Pavy PA-C Scr: 0.91 on 09/13/23  Epic Age: 83 Weight: 85.2kg  Based on above findings Eliquis  5mg  twice daily is the appropriate dose.  Refill approved.

## 2024-04-18 ENCOUNTER — Encounter (HOSPITAL_BASED_OUTPATIENT_CLINIC_OR_DEPARTMENT_OTHER): Payer: Self-pay | Admitting: Emergency Medicine

## 2024-04-18 ENCOUNTER — Other Ambulatory Visit: Payer: Self-pay

## 2024-04-18 ENCOUNTER — Emergency Department (HOSPITAL_BASED_OUTPATIENT_CLINIC_OR_DEPARTMENT_OTHER)
Admission: EM | Admit: 2024-04-18 | Discharge: 2024-04-18 | Disposition: A | Discharge status: Home / Self Care | Attending: Emergency Medicine | Admitting: Emergency Medicine

## 2024-04-18 DIAGNOSIS — R1032 Left lower quadrant pain: Secondary | ICD-10-CM | POA: Diagnosis present

## 2024-04-18 DIAGNOSIS — R109 Unspecified abdominal pain: Secondary | ICD-10-CM

## 2024-04-18 LAB — CBC WITH DIFFERENTIAL/PLATELET
Abs Immature Granulocytes: 0.02 K/uL (ref 0.00–0.07)
Basophils Absolute: 0 K/uL (ref 0.0–0.1)
Basophils Relative: 0 %
Eosinophils Absolute: 0.1 K/uL (ref 0.0–0.5)
Eosinophils Relative: 1 %
HCT: 38.5 % — ABNORMAL LOW (ref 39.0–52.0)
Hemoglobin: 12.9 g/dL — ABNORMAL LOW (ref 13.0–17.0)
Immature Granulocytes: 0 %
Lymphocytes Relative: 15 %
Lymphs Abs: 1.5 K/uL (ref 0.7–4.0)
MCH: 28.6 pg (ref 26.0–34.0)
MCHC: 33.5 g/dL (ref 30.0–36.0)
MCV: 85.4 fL (ref 80.0–100.0)
Monocytes Absolute: 0.8 K/uL (ref 0.1–1.0)
Monocytes Relative: 8 %
Neutro Abs: 7.3 K/uL (ref 1.7–7.7)
Neutrophils Relative %: 76 %
Platelets: 173 K/uL (ref 150–400)
RBC: 4.51 MIL/uL (ref 4.22–5.81)
RDW: 14.1 % (ref 11.5–15.5)
WBC: 9.8 K/uL (ref 4.0–10.5)
nRBC: 0 % (ref 0.0–0.2)

## 2024-04-18 LAB — COMPREHENSIVE METABOLIC PANEL WITH GFR
ALT: 18 U/L (ref 0–44)
AST: 22 U/L (ref 15–41)
Albumin: 4.2 g/dL (ref 3.5–5.0)
Alkaline Phosphatase: 69 U/L (ref 38–126)
Anion gap: 12 (ref 5–15)
BUN: 19 mg/dL (ref 8–23)
CO2: 26 mmol/L (ref 22–32)
Calcium: 8.9 mg/dL (ref 8.9–10.3)
Chloride: 102 mmol/L (ref 98–111)
Creatinine, Ser: 0.87 mg/dL (ref 0.61–1.24)
GFR, Estimated: >60 mL/min
Glucose, Bld: 108 mg/dL — ABNORMAL HIGH (ref 70–99)
Potassium: 3.6 mmol/L (ref 3.5–5.1)
Sodium: 140 mmol/L (ref 135–145)
Total Bilirubin: 0.5 mg/dL (ref 0.0–1.2)
Total Protein: 6.7 g/dL (ref 6.5–8.1)

## 2024-04-18 LAB — LIPASE, BLOOD: Lipase: 12 U/L (ref 11–51)

## 2024-04-18 LAB — LACTIC ACID, PLASMA: Lactic Acid, Venous: 1 mmol/L (ref 0.5–1.9)

## 2024-04-18 MED ORDER — AMOXICILLIN-POT CLAVULANATE 875-125 MG PO TABS
1.0000 | ORAL_TABLET | Freq: Once | ORAL | Status: AC
  Administered 2024-04-18: 1 via ORAL
  Filled 2024-04-18: qty 1

## 2024-04-18 MED ORDER — AMOXICILLIN-POT CLAVULANATE 875-125 MG PO TABS: 1.0000 | ORAL_TABLET | Freq: Two times a day (BID) | ORAL | 0 refills | Status: AC

## 2024-04-18 NOTE — ED Provider Notes (Signed)
 " [facility] Provider Note   CSN: [id number] Arrival date & time: 04/18/24  0225     History Chief Complaint  Patient presents with   Abdominal Pain    HPI Samuel [name] is a 84 y.o. male presenting for chief complaint of abdominal pain in his left lower quadrant.  States he has a longstanding history of diverticulitis and states this feels just like prior episodes.  Denies fevers chills nausea vomiting syncope or shortness of breath.  Actually states the pain is starting to improve now.  States he normally just gets Augmentin  from his PCP but due to the holiday he has been unable to get a hold of them..   Patient's recorded medical, surgical, social, medication list and allergies were reviewed in the Snapshot window as part of the initial history.   Review of Systems   Review of Systems  Constitutional:  Negative for chills and fever.  HENT:  Negative for ear pain and sore throat.   Eyes:  Negative for pain and visual disturbance.  Respiratory:  Negative for cough and shortness of breath.   Cardiovascular:  Negative for chest pain and palpitations.  Gastrointestinal:  Positive for abdominal pain. Negative for vomiting.  Genitourinary:  Negative for dysuria and hematuria.  Musculoskeletal:  Negative for arthralgias and back pain.  Skin:  Negative for color change and rash.  Neurological:  Negative for seizures and syncope.  All other systems reviewed and are negative.   Physical Exam Updated Vital Signs BP 134/76   Pulse 79   Temp 98.4 F (36.9 C) (Oral)   Resp 18   Wt 85.2 kg   SpO2 92%   BMI 26.95 kg/m  Physical Exam Vitals and nursing note reviewed.  Constitutional:      General: He is not in acute distress.    Appearance: He is well-developed.  HENT:     Head: Normocephalic and atraumatic.  Eyes:     Conjunctiva/sclera: Conjunctivae normal.  Cardiovascular:     Rate and Rhythm: Normal rate and regular rhythm.      Heart sounds: No murmur heard. Pulmonary:     Effort: Pulmonary effort is normal. No respiratory distress.     Breath sounds: Normal breath sounds.  Abdominal:     Palpations: Abdomen is soft.     Tenderness: There is abdominal tenderness.  Musculoskeletal:        General: No swelling.     Cervical back: Neck supple.  Skin:    General: Skin is warm and dry.     Capillary Refill: Capillary refill takes less than 2 seconds.  Neurological:     Mental Status: He is alert.  Psychiatric:        Mood and Affect: Mood normal.      ED Course/ Medical Decision Making/ A&P    Procedures Procedures   Medications Ordered in ED Medications  amoxicillin -clavulanate (AUGMENTIN ) 875-125 MG per tablet 1 tablet (1 tablet Oral Given 04/18/24 0429)    Medical Decision Making:   Samuel [name] is a 84 y.o. male who is having a chief complaint of abdominal pain. He is overall well-appearing.  Very tender in the left lower quadrant otherwise well-appearing. He states this feels just like his diverticulitis.  Recommend getting a CT scan to rule out other pathology in a patient of his age bracket but patient stated he would strongly prefer just blood work.  Stated that if his blood work had something  grossly atypical he would feel comfortable proceeding with CT but given recurrence of diverticulitis in his life he states he just wants the antibiotic prescription if able. Lab work performed grossly reassuring.  Discussed with the patient he still would prefer to hold off on cross-sectional imaging to rule out perforation or abscess as he feels overall well.  Minimal to ambulate to the bathroom without difficulty and states only mild pain.  I believe this is reasonable at this time.  Will treat with Augmentin  recommend follow-up with PCP within 72 hours. Disposition:  I have considered need for hospitalization, however, considering all of the above, I believe this patient is stable for discharge at this  time.  Patient/family educated about specific return precautions for given chief complaint and symptoms.  Patient/family educated about follow-up with PCP.     Patient/family expressed understanding of return precautions and need for follow-up. Patient spoken to regarding all imaging and laboratory results and appropriate follow up for these results. All education provided in verbal form with additional information in written form. Time was allowed for answering of patient questions. Patient discharged.    Emergency Department Medication Summary:   Medications  amoxicillin -clavulanate (AUGMENTIN ) 875-125 MG per tablet 1 tablet (1 tablet Oral Given 04/18/24 0429)    Clinical Impression:  1. Abdominal pain, unspecified abdominal location      Data Unavailable   Final Clinical Impression(s) / ED Diagnoses Final diagnoses:  Abdominal pain, unspecified abdominal location    Rx / DC Orders ED Discharge Orders          Ordered    amoxicillin -clavulanate (AUGMENTIN ) 875-125 MG tablet  2 times daily        04/18/24 0440              Jerral Meth, MD 04/18/24 0440  "

## 2024-04-18 NOTE — ED Triage Notes (Signed)
 Pt in with pain to LLQ, began last night at 10pm. Pt states pain is constant, tender and gas-like. States he took a gas-x PTA and this brought no relief. Hx of diverticulitis, denies any n/v/d or fevers.

## 2024-04-24 NOTE — Progress Notes (Signed)
 "    HPI: FU atrial fibrillation. Nuclear study July 2017 showed ejection fraction 57% and no ischemia or infarction.  Abdominal CT October 2019 showed aortic atherosclerosis but no aneurysm.  Echocardiogram April 2025 showed normal LV function, mild right ventricular large meant, moderate left atrial enlargement, moderate right atrial enlargement, mild mitral regurgitation, moderate aortic stenosis with mean gradient 18 mmHg, trace aortic insufficiency.  Patient diagnosed with new onset atrial fibrillation in May 2025.  Monitor May 2025 showed sinus rhythm with PACs, short runs of SVT, PVCs, 6 beats nonsustained ventricular tachycardia, intermittent Mobitz 1 second-degree AV block in the early a.m. hours and 3.3 and 3-second pause at 4:03 AM and 3:38 AM.  Since last seen patient denies increased dyspnea on exertion, orthopnea, PND, pedal edema, exertional chest pain or syncope.  He is having difficulties with occasional anxiety.  Current Outpatient Medications  Medication Sig Dispense Refill   amLODipine (NORVASC) 10 MG tablet Take 10 mg by mouth daily.     amLODipine (NORVASC) 5 MG tablet Take 5 mg by mouth daily.     busPIRone (BUSPAR) 15 MG tablet Take 15 mg by mouth 2 (two) times daily.     Cholecalciferol (VITAMIN D3) 2000 UNITS TABS Take 1 tablet by mouth daily.      clidinium-chlordiazePOXIDE  (LIBRAX) 5-2.5 MG capsule Take 1 capsule by mouth 3 (three) times daily before meals. (Patient taking differently: Take 1 capsule by mouth as needed.) 30 capsule 3   co-enzyme Q-10 30 MG capsule Take 30 mg by mouth daily.     Echinacea 400 MG CAPS Take 1 capsule by mouth daily at 6 (six) AM.     ELIQUIS  5 MG TABS tablet Take 1 tablet (5 mg total) by mouth 2 (two) times daily. 60 tablet 3   glycopyrrolate  (ROBINUL ) 2 MG tablet TAKE ONE TABLET BY MOUTH TWICE DAILY (Patient taking differently: Take 2 mg by mouth daily.) 180 tablet 2   hydrochlorothiazide (HYDRODIURIL) 25 MG tablet Take 25 mg by mouth daily.      Multiple Vitamins-Minerals (PRESERVISION AREDS) TABS Take 1 tablet by mouth daily at 6 (six) AM.     pantoprazole  (PROTONIX ) 40 MG tablet Take 1 tablet (40 mg total) by mouth 2 (two) times daily before a meal. 180 tablet 3   Probiotic TBEC Take 1 capsule by mouth daily at 6 (six) AM.     tamsulosin (FLOMAX) 0.4 MG CAPS capsule Take 0.4 mg by mouth daily.     Turmeric 500 MG TABS Take 1 tablet by mouth daily.     vitamin E 180 MG (400 UNITS) capsule Take 400 Units by mouth daily.     Zinc 50 MG TABS Take 1 tablet by mouth daily.     busPIRone (BUSPAR) 10 MG tablet Take 10 mg by mouth 2 (two) times daily.     celecoxib (CELEBREX) 200 MG capsule Take 200 mg by mouth as needed. For knee     cyanocobalamin  (VITAMIN B12) 1000 MCG/ML injection Inject 1 mL (1,000 mcg total) into the skin once a week.     diltiazem  (CARDIZEM ) 30 MG tablet Take 1 tablet (30 mg total) by mouth daily as needed (palpitations). 30 tablet 0   No current facility-administered medications for this visit.     Past Medical History:  Diagnosis Date   Arthritis    Diverticulosis    GERD (gastroesophageal reflux disease)    Heart murmur    Hemorrhoids    Hiatal hernia    Hypertension  Tubular adenoma of colon 01/2009    Past Surgical History:  Procedure Laterality Date   COLONOSCOPY     HERNIA REPAIR     POLYPECTOMY     REPLACEMENT TOTAL KNEE  02/14/11   left knee    Social History   Socioeconomic History   Marital status: Married    Spouse name: Not on file   Number of children: 4   Years of education: Not on file   Highest education level: Not on file  Occupational History   Not on file  Tobacco Use   Smoking status: Former    Current packs/day: 0.00    Types: Cigarettes    Quit date: 02/21/1964    Years since quitting: 60.2   Smokeless tobacco: Never  Vaping Use   Vaping status: Never Used  Substance and Sexual Activity   Alcohol use: No   Drug use: No   Sexual activity: Not on file   Other Topics Concern   Not on file  Social History Narrative   Not on file   Social Drivers of Health   Tobacco Use: Medium Risk (04/30/2024)   Patient History    Smoking Tobacco Use: Former    Smokeless Tobacco Use: Never    Passive Exposure: Not on Actuary Strain: Not on file  Food Insecurity: Not on file  Transportation Needs: Not on file  Physical Activity: Not on file  Stress: Not on file  Social Connections: Not on file  Intimate Partner Violence: Not on file  Depression (PHQ2-9): Not on file  Alcohol Screen: Not on file  Housing: Not on file  Utilities: Not on file  Health Literacy: Not on file    Family History  Problem Relation Age of Onset   Breast cancer Sister    Pancreatic cancer Brother    Colon cancer Neg Hx    Esophageal cancer Neg Hx    Rectal cancer Neg Hx    Stomach cancer Neg Hx     ROS: no fevers or chills, productive cough, hemoptysis, dysphasia, odynophagia, melena, hematochezia, dysuria, hematuria, rash, seizure activity, orthopnea, PND, pedal edema, claudication. Remaining systems are negative.  Physical Exam: Well-developed well-nourished in no acute distress.  Skin is warm and dry.  HEENT is normal.  Neck is supple.  Chest is clear to auscultation with normal expansion.  Cardiovascular exam is regular rate and rhythm.  3/6 systolic murmur left sternal border. Abdominal exam nontender or distended. No masses palpated. Extremities show no edema. neuro grossly intact  EKG Interpretation Date/Time:  Tuesday April 30 2024 10:42:13 EST Ventricular Rate:  78 PR Interval:  262 QRS Duration:  96 QT Interval:  372 QTC Calculation: 424 R Axis:   14  Text Interpretation: Sinus rhythm with 1st degree A-V block with occasional Premature ventricular complexes Confirmed by Pietro Rogue ([id number]) on 04/30/2024 10:51:05 AM     A/P  1 atrial fibrillation-patient remains in sinus rhythm today.  Will avoid AV nodal blocking  agents given history of bradycardia on his monitor.  Continue apixaban .  Will consider addition of antiarrhythmic in the future if he has more frequent episodes.  2 hypertension-blood pressure controlled.  Continue present medical regimen.  3 aortic stenosis-FU echo 4/26.  We discussed the symptoms to be aware of including worsening dyspnea, chest pain or syncope.  He understands he will likely require TAVR in the future.  4 bradycardia-noted to have bradycardia and some pauses but in the early a.m. hours on previous monitor.  He has not had syncope.  Will plan to follow for now.  May require pacemaker in the future.  Redell Shallow, MD    "

## 2024-04-30 ENCOUNTER — Encounter: Payer: Self-pay | Admitting: Cardiology

## 2024-04-30 ENCOUNTER — Ambulatory Visit: Attending: Cardiology | Admitting: Cardiology

## 2024-04-30 VITALS — BP 134/50 | HR 75 | Ht 71.0 in | Wt 191.0 lb

## 2024-04-30 DIAGNOSIS — I10 Essential (primary) hypertension: Secondary | ICD-10-CM | POA: Diagnosis not present

## 2024-04-30 DIAGNOSIS — I4891 Unspecified atrial fibrillation: Secondary | ICD-10-CM

## 2024-04-30 DIAGNOSIS — I350 Nonrheumatic aortic (valve) stenosis: Secondary | ICD-10-CM | POA: Diagnosis not present

## 2024-04-30 DIAGNOSIS — R001 Bradycardia, unspecified: Secondary | ICD-10-CM | POA: Diagnosis not present

## 2024-04-30 NOTE — Patient Instructions (Addendum)
 Medication Instructions:  Continue same medications *If you need a refill on your cardiac medications before your next appointment, please call your pharmacy*  Lab Work: None ordered  Testing/Procedures: Schedule Echo in 07/2024  Follow-Up: At [facility], you and your health needs are our priority.  As part of our continuing mission to provide you with exceptional heart care, our providers are all part of one team.  This team includes your primary Cardiologist (physician) and Advanced Practice Providers or APPs (Physician Assistants and Nurse Practitioners) who all work together to provide you with the care you need, when you need it.  Your next appointment:  6 months   Call in April to schedule July appointment     Provider:  Unice     We recommend signing up for the patient portal called MyChart.  Sign up information is provided on this After Visit Summary.  MyChart is used to connect with patients for Virtual Visits (Telemedicine).  Patients are able to view lab/test results, encounter notes, upcoming appointments, etc.  Non-urgent messages can be sent to your provider as well.   To learn more about what you can do with MyChart, go to [email or web address].   Other Instructions

## 2024-05-08 NOTE — Progress Notes (Signed)
"   Order(s) created erroneously. Erroneous order ID: [id number]  Order moved by: CHART CORRECTION ANALSYST SIXTEEN, IDENTITY  Order move date/time: 05/08/2024 11:12 AM  Source Patient: [id number]  Source Contact: 04/30/2024  Destination Patient: [id number]  Destination Contact: 09/28/2022 "

## 2024-07-19 ENCOUNTER — Ambulatory Visit (HOSPITAL_COMMUNITY)
